# Patient Record
Sex: Female | Born: 1935 | Race: White | Hispanic: No | Smoking: Never smoker
Health system: Southern US, Community
[De-identification: ages and names within clinical notes are randomized; demographics above are authoritative.]

## PROBLEM LIST (undated history)

## (undated) DIAGNOSIS — E0590 Thyrotoxicosis, unspecified without thyrotoxic crisis or storm: Secondary | ICD-10-CM

## (undated) DIAGNOSIS — M899 Disorder of bone, unspecified: Secondary | ICD-10-CM

## (undated) DIAGNOSIS — M25569 Pain in unspecified knee: Secondary | ICD-10-CM

## (undated) DIAGNOSIS — M25559 Pain in unspecified hip: Secondary | ICD-10-CM

## (undated) DIAGNOSIS — T8859XA Other complications of anesthesia, initial encounter: Secondary | ICD-10-CM

## (undated) DIAGNOSIS — T4145XA Adverse effect of unspecified anesthetic, initial encounter: Secondary | ICD-10-CM

## (undated) DIAGNOSIS — M949 Disorder of cartilage, unspecified: Secondary | ICD-10-CM

## (undated) DIAGNOSIS — G2581 Restless legs syndrome: Secondary | ICD-10-CM

## (undated) DIAGNOSIS — R209 Unspecified disturbances of skin sensation: Secondary | ICD-10-CM

## (undated) DIAGNOSIS — E162 Hypoglycemia, unspecified: Secondary | ICD-10-CM

## (undated) DIAGNOSIS — R2681 Unsteadiness on feet: Secondary | ICD-10-CM

## (undated) DIAGNOSIS — H269 Unspecified cataract: Secondary | ICD-10-CM

## (undated) DIAGNOSIS — R112 Nausea with vomiting, unspecified: Secondary | ICD-10-CM

## (undated) DIAGNOSIS — M24569 Contracture, unspecified knee: Secondary | ICD-10-CM

## (undated) DIAGNOSIS — M1990 Unspecified osteoarthritis, unspecified site: Secondary | ICD-10-CM

## (undated) DIAGNOSIS — Z9889 Other specified postprocedural states: Secondary | ICD-10-CM

## (undated) HISTORY — DX: Unspecified cataract: H26.9

## (undated) HISTORY — DX: Contracture, unspecified knee: M24.569

## (undated) HISTORY — DX: Pain in unspecified knee: M25.569

## (undated) HISTORY — DX: Thyrotoxicosis, unspecified without thyrotoxic crisis or storm: E05.90

## (undated) HISTORY — DX: Restless legs syndrome: G25.81

## (undated) HISTORY — PX: CHOLECYSTECTOMY: SHX55

## (undated) HISTORY — DX: Pain in unspecified hip: M25.559

## (undated) HISTORY — DX: Disorder of cartilage, unspecified: M94.9

## (undated) HISTORY — DX: Disorder of bone, unspecified: M89.9

## (undated) HISTORY — DX: Unspecified disturbances of skin sensation: R20.9

---
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
## 1998-05-09 ENCOUNTER — Other Ambulatory Visit: Admission: RE | Admit: 1998-05-09 | Discharge: 1998-05-09 | Payer: Self-pay | Admitting: Obstetrics and Gynecology

## 1999-03-06 ENCOUNTER — Encounter: Admission: RE | Admit: 1999-03-06 | Discharge: 1999-04-03 | Payer: Self-pay | Admitting: Endocrinology

## 1999-08-04 ENCOUNTER — Other Ambulatory Visit: Admission: RE | Admit: 1999-08-04 | Discharge: 1999-08-04 | Payer: Self-pay | Admitting: Obstetrics and Gynecology

## 1999-11-16 ENCOUNTER — Other Ambulatory Visit: Admission: RE | Admit: 1999-11-16 | Discharge: 1999-11-16 | Payer: Self-pay | Admitting: Obstetrics and Gynecology

## 2000-09-09 ENCOUNTER — Other Ambulatory Visit: Admission: RE | Admit: 2000-09-09 | Discharge: 2000-09-09 | Payer: Self-pay | Admitting: Obstetrics and Gynecology

## 2001-09-18 ENCOUNTER — Other Ambulatory Visit: Admission: RE | Admit: 2001-09-18 | Discharge: 2001-09-18 | Payer: Self-pay | Admitting: Obstetrics and Gynecology

## 2003-03-03 ENCOUNTER — Inpatient Hospital Stay (HOSPITAL_COMMUNITY): Admission: AD | Admit: 2003-03-03 | Discharge: 2003-03-05 | Payer: Self-pay | Admitting: Endocrinology

## 2003-03-04 ENCOUNTER — Encounter: Payer: Self-pay | Admitting: *Deleted

## 2004-09-01 ENCOUNTER — Encounter: Admission: RE | Admit: 2004-09-01 | Discharge: 2004-09-01 | Payer: Self-pay | Admitting: Family Medicine

## 2005-03-10 ENCOUNTER — Emergency Department (HOSPITAL_COMMUNITY): Admission: EM | Admit: 2005-03-10 | Discharge: 2005-03-10 | Payer: Self-pay | Admitting: Emergency Medicine

## 2009-05-11 LAB — HM COLONOSCOPY

## 2011-08-13 DIAGNOSIS — H902 Conductive hearing loss, unspecified: Secondary | ICD-10-CM | POA: Diagnosis not present

## 2011-08-13 DIAGNOSIS — H6120 Impacted cerumen, unspecified ear: Secondary | ICD-10-CM | POA: Diagnosis not present

## 2011-12-12 DIAGNOSIS — Z1231 Encounter for screening mammogram for malignant neoplasm of breast: Secondary | ICD-10-CM | POA: Diagnosis not present

## 2011-12-27 DIAGNOSIS — Z13220 Encounter for screening for lipoid disorders: Secondary | ICD-10-CM | POA: Diagnosis not present

## 2011-12-27 DIAGNOSIS — E785 Hyperlipidemia, unspecified: Secondary | ICD-10-CM | POA: Diagnosis not present

## 2011-12-31 DIAGNOSIS — M1990 Unspecified osteoarthritis, unspecified site: Secondary | ICD-10-CM | POA: Diagnosis not present

## 2011-12-31 DIAGNOSIS — H6120 Impacted cerumen, unspecified ear: Secondary | ICD-10-CM | POA: Diagnosis not present

## 2011-12-31 DIAGNOSIS — M25569 Pain in unspecified knee: Secondary | ICD-10-CM | POA: Diagnosis not present

## 2011-12-31 DIAGNOSIS — M899 Disorder of bone, unspecified: Secondary | ICD-10-CM | POA: Diagnosis not present

## 2012-01-07 ENCOUNTER — Other Ambulatory Visit: Payer: Self-pay | Admitting: Internal Medicine

## 2012-01-07 ENCOUNTER — Ambulatory Visit
Admission: RE | Admit: 2012-01-07 | Discharge: 2012-01-07 | Disposition: A | Discharge status: Home / Self Care | Payer: Medicare Other | Source: Ambulatory Visit | Attending: Internal Medicine | Admitting: Internal Medicine

## 2012-01-07 DIAGNOSIS — R52 Pain, unspecified: Secondary | ICD-10-CM

## 2012-01-07 DIAGNOSIS — M25569 Pain in unspecified knee: Secondary | ICD-10-CM | POA: Diagnosis not present

## 2012-01-07 DIAGNOSIS — M1710 Unilateral primary osteoarthritis, unspecified knee: Secondary | ICD-10-CM | POA: Diagnosis not present

## 2012-04-10 DIAGNOSIS — H6120 Impacted cerumen, unspecified ear: Secondary | ICD-10-CM | POA: Diagnosis not present

## 2012-04-10 DIAGNOSIS — H902 Conductive hearing loss, unspecified: Secondary | ICD-10-CM | POA: Diagnosis not present

## 2012-05-30 DIAGNOSIS — Z23 Encounter for immunization: Secondary | ICD-10-CM | POA: Diagnosis not present

## 2012-08-18 DIAGNOSIS — E0590 Thyrotoxicosis, unspecified without thyrotoxic crisis or storm: Secondary | ICD-10-CM | POA: Diagnosis not present

## 2012-08-18 DIAGNOSIS — H269 Unspecified cataract: Secondary | ICD-10-CM | POA: Diagnosis not present

## 2012-08-18 DIAGNOSIS — M1990 Unspecified osteoarthritis, unspecified site: Secondary | ICD-10-CM | POA: Diagnosis not present

## 2012-09-16 ENCOUNTER — Ambulatory Visit
Admission: RE | Admit: 2012-09-16 | Discharge: 2012-09-16 | Disposition: A | Discharge status: Home / Self Care | Payer: Medicare Other | Source: Ambulatory Visit | Attending: Internal Medicine | Admitting: Internal Medicine

## 2012-09-16 ENCOUNTER — Other Ambulatory Visit: Payer: Self-pay | Admitting: Internal Medicine

## 2012-09-16 DIAGNOSIS — G8929 Other chronic pain: Secondary | ICD-10-CM

## 2012-09-16 DIAGNOSIS — M169 Osteoarthritis of hip, unspecified: Secondary | ICD-10-CM | POA: Diagnosis not present

## 2012-10-16 DIAGNOSIS — M169 Osteoarthritis of hip, unspecified: Secondary | ICD-10-CM | POA: Diagnosis not present

## 2012-10-16 DIAGNOSIS — M25559 Pain in unspecified hip: Secondary | ICD-10-CM | POA: Diagnosis not present

## 2012-10-23 ENCOUNTER — Encounter: Payer: Self-pay | Admitting: Internal Medicine

## 2012-10-23 ENCOUNTER — Ambulatory Visit (INDEPENDENT_AMBULATORY_CARE_PROVIDER_SITE_OTHER): Payer: Medicare Other | Admitting: Internal Medicine

## 2012-10-23 VITALS — BP 112/62 | HR 80 | Temp 97.9°F | Resp 15 | Wt 119.0 lb

## 2012-10-23 DIAGNOSIS — M169 Osteoarthritis of hip, unspecified: Secondary | ICD-10-CM | POA: Insufficient documentation

## 2012-10-23 DIAGNOSIS — I10 Essential (primary) hypertension: Secondary | ICD-10-CM | POA: Diagnosis not present

## 2012-10-23 DIAGNOSIS — Z01818 Encounter for other preprocedural examination: Secondary | ICD-10-CM

## 2012-10-23 DIAGNOSIS — M1610 Unilateral primary osteoarthritis, unspecified hip: Secondary | ICD-10-CM | POA: Diagnosis not present

## 2012-10-23 NOTE — Assessment & Plan Note (Addendum)
Left greater than right.  Has plans for replacement via anterior approach with Dr. Magnus Ivan.  He has asked me to medically clear her.  She had a normal EKG today and all of her labwork has been unremarkable.  We discussed the risks of surgery due to her age, but she simply feels she does not have good quality of life moving as slowly as she does at this time.  She has no other active medical conditions besides her chronic, physical disability and discomfort.  Her son was also present for the visit.  They also understand that she will require significant rehab in a SNF after the surgery.  We can provide her a list of the [facility] facilities which does include [facility] and [facility].

## 2012-10-23 NOTE — Progress Notes (Signed)
Patient ID: Stacey Clay, female   DOB: 14-Jan-1923, 79 y.o.   MRN: [id number]  Allergies  Allergen Reactions  . Aleve (Naproxen Sodium)   . Aspirin   . Darvocet (Propoxyphene-Acetaminophen)   . Darvon (Propoxyphene Hcl)   . Lomotil (Diphenoxylate)   . Ultram (Tramadol)     Chief Complaint  Patient presents with  . Medical Managment of Chronic Issues  . Hip Surgery to be scheduled by Dr.Blackman. Need clearance f    HPI: Patient is a 79 y.o. Caucasian female seen in the office today for medical clearance for hip surgery.Has decided she wants hip replacement.  Dr. Magnus Ivan and a friend of hers spoke about [facility].  See more details below.  Review of Systems:  Review of Systems  Constitutional: Negative for fever, chills and weight loss.  HENT: Positive for hearing loss.        Flushing and debrox did not help.  Did go to ENT Dr. Ezzard Standing identified hearing loss.  Didn't want to get into hearing aides at that time.    Eyes: Negative for blurred vision.  Respiratory: Negative for shortness of breath.   Cardiovascular: Negative for chest pain, palpitations and leg swelling.  Gastrointestinal: Negative for heartburn and constipation.  Genitourinary: Negative for urgency.       Does have to hurry a little at night, but makes it to bathroom  Musculoskeletal: Positive for joint pain. Negative for falls.  Skin: Negative for rash.  Neurological: Negative for dizziness and weakness.       Has right knee contracture  Endo/Heme/Allergies: Does not bruise/bleed easily.  Psychiatric/Behavioral: Negative for depression and memory loss.     Past Medical History  Diagnosis Date  . Contracture of lower leg joint   . Pain in joint, pelvic region and thigh   . Disturbance of skin sensation   . Pain in joint, lower leg   . Restless legs syndrome (RLS)   . Disorder of bone and cartilage, unspecified   . 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 Unspecified cataract    Past Surgical History  Procedure Laterality Date  . Tonsillectomy  1932  . Goiter resection  1958  . Cholecystectomy     Social History:   reports that she has never smoked. She does not have any smokeless tobacco history on file. She reports that she does not drink alcohol or use illicit drugs.  No family history on file.  Medications: Patient's Medications  New Prescriptions   No medications on file  Previous Medications   ACETAMINOPHEN (TYLENOL) 500 MG TABLET    Take 500 mg by mouth every 6 (six) hours as needed for pain.   MULTIPLE VITAMIN (MULTIVITAMIN) TABLET    Take 1 tablet by mouth daily.  Modified Medications   No medications on file  Discontinued Medications   No medications on file   Physical Exam: Filed Vitals:   10/23/12 1553  BP: 112/62  Pulse: 80  Temp: 97.9 F (36.6 C)  TempSrc: Oral  Resp: 15  Weight: 119 lb (53.978 kg)  SpO2: 99%  Physical Exam  Constitutional: She is oriented to person, place, and time.  Thin Caucasian female, NAD  HENT:  Head: Normocephalic and atraumatic.  Right Ear: External ear normal.  Left Ear: External ear normal.  Nose: Nose normal.  Mouth/Throat: Oropharynx is clear and moist.  Eyes: Conjunctivae and EOM are normal. Pupils are equal, round, and  reactive to light.  Neck: Normal range of motion. Neck supple. No JVD present.  Cardiovascular: Normal rate, regular rhythm, normal heart sounds and intact distal pulses.   Pulmonary/Chest: Effort normal and breath sounds normal.  Abdominal: Soft. Bowel sounds are normal. She exhibits no distension and no mass. There is no tenderness.  Musculoskeletal: She exhibits no edema and no tenderness.  Contracture present of right knee, discomfort in left hip and very slow, asymmetric gait using rollator walker  Neurological: She is alert and oriented to person, place, and time. No cranial nerve deficit.  Skin: Skin is warm and dry.   Psychiatric: She has a normal mood and affect. Her behavior is normal. Judgment and thought content normal.    Labs reviewed: 07/12/2010  (Dr. Dagoberto Ligas) CBC: rbc 4.14, Hemoglobin 12.8, wbc 4.3 CMP: glucose 88, BUN 21, Creatinine 0.6 T4: 0.83, TSH: 2.48 12/27/2011 Lipid Panel Cholesterol 162 Triglycerides 88 Hdl 64 Ldl 80  08/18/12 CBC; WBC 4.7, RBC 3.90, HGB 12.8 Glucose; 96, BUN 19, Creatinine 0.53 TSH 2.510  Past Procedures: Yearly Pap Smears--not gone in a couple of years Yearly Mammograms--not gone in a couple of years Colonoscopy--done when had stomach bleeding, also had EGD, no polyps 12/16/2008-Bone Density: Low Bone Mass 1984-Swallowed radioactive Iodine 09/16/12 DG Hip Complete Left - Significant worsening of severe degenerative joint disease involving the left hip. 09/16/12 DG Pelvis 1-2 Views - Significant progression of degenerative joint disease involving both hips. 10/23/12:  EKG:  Normal sinus rhythm, no ischemia or infarct  Assessment/Plan DJD (degenerative joint disease) of hip Left greater than right.  Has plans for replacement via anterior approach with Dr. Magnus Ivan.  He has asked me to medically clear her.  She had a normal EKG today and all of her labwork has been unremarkable.  We discussed the risks of surgery due to her age, but she simply feels she does not have good quality of life moving as slowly as she does at this time.  She has no other active medical conditions besides her chronic, physical disability and discomfort.  Her son was also present for the visit.  They also understand that she will require significant rehab in a SNF after the surgery.  We can provide her a list of the [facility] facilities which does include [facility] and [facility].   Labs/tests ordered: EKG, CBC, BMP today

## 2012-10-23 NOTE — Patient Instructions (Signed)
Follow up with me after your surgery.

## 2012-10-24 LAB — BASIC METABOLIC PANEL
BUN/Creatinine Ratio: 49 — ABNORMAL HIGH (ref 11–26)
BUN: 24 mg/dL (ref 8–27)
CO2: 23 mmol/L (ref 19–28)
Calcium: 9.7 mg/dL (ref 8.6–10.2)
Chloride: 105 mmol/L (ref 97–108)
Creatinine, Ser: 0.49 mg/dL — ABNORMAL LOW (ref 0.57–1.00)
GFR calc Af Amer: 101 mL/min/{1.73_m2} (ref >59)
GFR calc non Af Amer: 88 mL/min/{1.73_m2} (ref >59)
Glucose: 80 mg/dL (ref 65–99)
Potassium: 4.6 mmol/L (ref 3.5–5.2)
Sodium: 140 mmol/L (ref 134–144)

## 2012-10-24 LAB — CBC WITH DIFFERENTIAL/PLATELET
Basophils Absolute: 0 10*3/uL (ref 0.0–0.2)
Basos: 0 % (ref 0–3)
Eos: 3 % (ref 0–5)
Eosinophils Absolute: 0.1 10*3/uL (ref 0.0–0.4)
HCT: 38 % (ref 34.0–46.6)
Hemoglobin: 12.9 g/dL (ref 11.1–15.9)
Immature Grans (Abs): 0 10*3/uL (ref 0.0–0.1)
Immature Granulocytes: 0 % (ref 0–2)
Lymphocytes Absolute: 1.3 10*3/uL (ref 0.7–3.1)
Lymphs: 31 % (ref 14–46)
MCH: 32.5 pg (ref 26.6–33.0)
MCHC: 33.9 g/dL (ref 31.5–35.7)
MCV: 96 fL (ref 79–97)
Monocytes Absolute: 0.6 10*3/uL (ref 0.1–0.9)
Monocytes: 14 % — ABNORMAL HIGH (ref 4–12)
Neutrophils Absolute: 2.1 10*3/uL (ref 1.4–7.0)
Neutrophils Relative %: 52 % (ref 40–74)
RBC: 3.97 x10E6/uL (ref 3.77–5.28)
RDW: 12.9 % (ref 12.3–15.4)
WBC: 4.1 10*3/uL (ref 3.4–10.8)

## 2012-11-04 DIAGNOSIS — H902 Conductive hearing loss, unspecified: Secondary | ICD-10-CM | POA: Diagnosis not present

## 2012-11-04 DIAGNOSIS — H6120 Impacted cerumen, unspecified ear: Secondary | ICD-10-CM | POA: Diagnosis not present

## 2012-11-09 ENCOUNTER — Encounter: Payer: Self-pay | Admitting: Internal Medicine

## 2012-11-10 NOTE — Addendum Note (Signed)
Addended by: Avis Epley F on: 11/10/2012 08:38 AM   Modules accepted: Orders

## 2012-11-11 ENCOUNTER — Other Ambulatory Visit (HOSPITAL_COMMUNITY): Payer: Self-pay | Admitting: Orthopaedic Surgery

## 2012-11-13 ENCOUNTER — Encounter (HOSPITAL_COMMUNITY): Payer: Self-pay | Admitting: Pharmacy Technician

## 2012-11-19 ENCOUNTER — Other Ambulatory Visit (HOSPITAL_COMMUNITY): Payer: Self-pay | Admitting: Orthopaedic Surgery

## 2012-11-20 ENCOUNTER — Encounter (HOSPITAL_COMMUNITY)
Admission: RE | Admit: 2012-11-20 | Discharge: 2012-11-20 | Disposition: A | Discharge status: Home / Self Care | Payer: Medicare Other | Source: Ambulatory Visit | Attending: Orthopaedic Surgery | Admitting: Orthopaedic Surgery

## 2012-11-20 ENCOUNTER — Encounter (HOSPITAL_COMMUNITY): Payer: Self-pay

## 2012-11-20 DIAGNOSIS — M899 Disorder of bone, unspecified: Secondary | ICD-10-CM | POA: Diagnosis not present

## 2012-11-20 DIAGNOSIS — S72009A Fracture of unspecified part of neck of unspecified femur, initial encounter for closed fracture: Secondary | ICD-10-CM | POA: Diagnosis not present

## 2012-11-20 DIAGNOSIS — R269 Unspecified abnormalities of gait and mobility: Secondary | ICD-10-CM | POA: Diagnosis not present

## 2012-11-20 DIAGNOSIS — E162 Hypoglycemia, unspecified: Secondary | ICD-10-CM | POA: Diagnosis not present

## 2012-11-20 DIAGNOSIS — M24569 Contracture, unspecified knee: Secondary | ICD-10-CM | POA: Diagnosis not present

## 2012-11-20 DIAGNOSIS — Z471 Aftercare following joint replacement surgery: Secondary | ICD-10-CM | POA: Diagnosis not present

## 2012-11-20 DIAGNOSIS — G2581 Restless legs syndrome: Secondary | ICD-10-CM | POA: Diagnosis not present

## 2012-11-20 DIAGNOSIS — M1610 Unilateral primary osteoarthritis, unspecified hip: Secondary | ICD-10-CM | POA: Diagnosis not present

## 2012-11-20 DIAGNOSIS — M169 Osteoarthritis of hip, unspecified: Secondary | ICD-10-CM | POA: Diagnosis present

## 2012-11-20 DIAGNOSIS — M1990 Unspecified osteoarthritis, unspecified site: Secondary | ICD-10-CM | POA: Diagnosis not present

## 2012-11-20 DIAGNOSIS — M6281 Muscle weakness (generalized): Secondary | ICD-10-CM | POA: Diagnosis not present

## 2012-11-20 DIAGNOSIS — Z01812 Encounter for preprocedural laboratory examination: Secondary | ICD-10-CM | POA: Diagnosis not present

## 2012-11-20 DIAGNOSIS — M25559 Pain in unspecified hip: Secondary | ICD-10-CM | POA: Diagnosis not present

## 2012-11-20 DIAGNOSIS — R293 Abnormal posture: Secondary | ICD-10-CM | POA: Diagnosis not present

## 2012-11-20 DIAGNOSIS — Z96649 Presence of unspecified artificial hip joint: Secondary | ICD-10-CM | POA: Diagnosis not present

## 2012-11-20 HISTORY — DX: Other specified postprocedural states: Z98.890

## 2012-11-20 HISTORY — DX: Unsteadiness on feet: R26.81

## 2012-11-20 HISTORY — DX: Nausea with vomiting, unspecified: R11.2

## 2012-11-20 HISTORY — DX: Adverse effect of unspecified anesthetic, initial encounter: T41.45XA

## 2012-11-20 HISTORY — DX: Other complications of anesthesia, initial encounter: T88.59XA

## 2012-11-20 HISTORY — DX: Nausea with vomiting, unspecified: Z98.890

## 2012-11-20 HISTORY — DX: Unspecified osteoarthritis, unspecified site: M19.90

## 2012-11-20 LAB — CBC
HCT: 40.7 % (ref 36.0–46.0)
Hemoglobin: 13.1 g/dL (ref 12.0–15.0)
MCH: 32 pg (ref 26.0–34.0)
MCHC: 32.2 g/dL (ref 30.0–36.0)
RDW: 13.2 % (ref 11.5–15.5)

## 2012-11-20 LAB — ABO/RH: ABO/RH(D): A POS

## 2012-11-20 LAB — SURGICAL PCR SCREEN: Staphylococcus aureus: POSITIVE — AB

## 2012-11-20 LAB — URINALYSIS, ROUTINE W REFLEX MICROSCOPIC
Glucose, UA: NEGATIVE mg/dL
Ketones, ur: NEGATIVE mg/dL
Protein, ur: NEGATIVE mg/dL
Urobilinogen, UA: 0.2 mg/dL (ref 0.0–1.0)

## 2012-11-20 LAB — BASIC METABOLIC PANEL
BUN: 22 mg/dL (ref 6–23)
Creatinine, Ser: 0.45 mg/dL — ABNORMAL LOW (ref 0.50–1.10)
GFR calc Af Amer: >90 mL/min (ref >90)
GFR calc non Af Amer: 88 mL/min — ABNORMAL LOW (ref >90)
Glucose, Bld: 76 mg/dL (ref 70–99)

## 2012-11-20 LAB — PROTIME-INR
INR: 0.95 (ref 0.00–1.49)
Prothrombin Time: 12.6 seconds (ref 11.6–15.2)

## 2012-11-20 LAB — URINE MICROSCOPIC-ADD ON

## 2012-11-20 NOTE — Pre-Procedure Instructions (Addendum)
11-20-12 EKG 4'14-Epic. No CXR today. Pt. Aware will see anesthesia AM of surgery preop.

## 2012-11-20 NOTE — Patient Instructions (Addendum)
20 MAHNOOR MATHISEN  11/20/2012   Your procedure is scheduled on:   11-28-2012  Report to [facility] at     0645   AM   Call this number if you have problems the morning of surgery: [phone]  Or Presurgical Testing [phone](Arshi Duarte)     Do not eat food:After Midnight.    Take these medicines the morning of surgery with A SIP OF WATER: Tylenol.   Do not wear jewelry, make-up or nail polish.  Do not wear lotions, powders, or perfumes. You may wear deodorant.  Do not shave 12 hours prior to first CHG shower(legs and under arms).(face and neck okay.)  Do not bring valuables to the hospital.  Contacts, dentures or bridgework,body piercing,  may not be worn into surgery.  Leave suitcase in the car. After surgery it may be brought to your room.  For patients admitted to the hospital, checkout time is 11:00 AM the day of discharge.   Patients discharged the day of surgery will not be allowed to drive home. Must have responsible person with you x 24 hours once discharged.  Name and phone number of your driver: Fayrene Fearing [phone] cell  Special Instructions: CHG(Chlorhedine 4%-"Hibiclens","Betasept","Aplicare") Shower Use Special Wash: see special instructions.(avoid face and genitals)   Please read over the following fact sheets that you were given: MRSA Information, Blood Transfusion fact sheet, Incentive Spirometry Instruction.    Failure to follow these instructions may result in Cancellation of your surgery.   Patient signature_______________________________________________________

## 2012-11-21 NOTE — Progress Notes (Signed)
+   mrsa and staph results faxed to be c blackman by fax in epic

## 2012-11-28 ENCOUNTER — Inpatient Hospital Stay (HOSPITAL_COMMUNITY): Payer: Medicare Other

## 2012-11-28 ENCOUNTER — Inpatient Hospital Stay (HOSPITAL_COMMUNITY): Payer: Medicare Other | Admitting: Anesthesiology

## 2012-11-28 ENCOUNTER — Encounter (HOSPITAL_COMMUNITY): Payer: Self-pay | Admitting: Anesthesiology

## 2012-11-28 ENCOUNTER — Inpatient Hospital Stay (HOSPITAL_COMMUNITY)
Admission: RE | Admit: 2012-11-28 | Discharge: 2012-12-02 | DRG: 470 | Disposition: A | Discharge status: Skilled Nursing Facility | Payer: Medicare Other | Source: Ambulatory Visit | Attending: Orthopaedic Surgery | Admitting: Orthopaedic Surgery

## 2012-11-28 ENCOUNTER — Encounter (HOSPITAL_COMMUNITY): Payer: Self-pay | Admitting: *Deleted

## 2012-11-28 ENCOUNTER — Encounter (HOSPITAL_COMMUNITY)
Admission: RE | Disposition: A | Discharge status: Skilled Nursing Facility | Payer: Self-pay | Source: Ambulatory Visit | Attending: Orthopaedic Surgery

## 2012-11-28 DIAGNOSIS — M1610 Unilateral primary osteoarthritis, unspecified hip: Principal | ICD-10-CM | POA: Diagnosis present

## 2012-11-28 DIAGNOSIS — R269 Unspecified abnormalities of gait and mobility: Secondary | ICD-10-CM | POA: Diagnosis present

## 2012-11-28 DIAGNOSIS — M169 Osteoarthritis of hip, unspecified: Secondary | ICD-10-CM

## 2012-11-28 DIAGNOSIS — E162 Hypoglycemia, unspecified: Secondary | ICD-10-CM | POA: Diagnosis present

## 2012-11-28 DIAGNOSIS — Z01812 Encounter for preprocedural laboratory examination: Secondary | ICD-10-CM

## 2012-11-28 HISTORY — PX: TOTAL HIP ARTHROPLASTY: SHX124

## 2012-11-28 HISTORY — DX: Hypoglycemia, unspecified: E16.2

## 2012-11-28 LAB — TYPE AND SCREEN: ABO/RH(D): A POS

## 2012-11-28 SURGERY — ARTHROPLASTY, HIP, TOTAL, ANTERIOR APPROACH
Anesthesia: Spinal | Site: Hip | Laterality: Left | Wound class: Clean

## 2012-11-28 MED ORDER — METHOCARBAMOL 500 MG PO TABS
500.0000 mg | ORAL_TABLET | Freq: Four times a day (QID) | ORAL | Status: DC | PRN
  Administered 2012-11-29 – 2012-12-01 (×3): 500 mg via ORAL
  Filled 2012-11-28 (×3): qty 1

## 2012-11-28 MED ORDER — MIDAZOLAM HCL 5 MG/5ML IJ SOLN
INTRAMUSCULAR | Status: DC | PRN
  Administered 2012-11-28: 2 mg via INTRAVENOUS

## 2012-11-28 MED ORDER — LACTATED RINGERS IV SOLN
INTRAVENOUS | Status: DC
  Administered 2012-11-28: 1000 mL via INTRAVENOUS
  Administered 2012-11-28 (×2): via INTRAVENOUS

## 2012-11-28 MED ORDER — ADULT MULTIVITAMIN W/MINERALS CH
1.0000 | ORAL_TABLET | Freq: Every day | ORAL | Status: DC
  Administered 2012-11-29 – 2012-12-01 (×3): 1 via ORAL
  Filled 2012-11-28 (×5): qty 1

## 2012-11-28 MED ORDER — FENTANYL CITRATE 0.05 MG/ML IJ SOLN
INTRAMUSCULAR | Status: DC | PRN
  Administered 2012-11-28: 50 ug via INTRAVENOUS

## 2012-11-28 MED ORDER — POLYETHYLENE GLYCOL 3350 17 G PO PACK
17.0000 g | PACK | Freq: Every day | ORAL | Status: DC | PRN
  Administered 2012-11-30 – 2012-12-01 (×3): 17 g via ORAL

## 2012-11-28 MED ORDER — SODIUM CHLORIDE 0.9 % IR SOLN
Status: DC | PRN
  Administered 2012-11-28: 1000 mL

## 2012-11-28 MED ORDER — FENTANYL CITRATE 0.05 MG/ML IJ SOLN
25.0000 ug | INTRAMUSCULAR | Status: DC | PRN
  Administered 2012-11-28: 25 ug via INTRAVENOUS
  Administered 2012-11-28: 50 ug via INTRAVENOUS
  Administered 2012-11-28: 25 ug via INTRAVENOUS

## 2012-11-28 MED ORDER — SODIUM CHLORIDE 0.9 % IV SOLN
INTRAVENOUS | Status: DC
  Administered 2012-11-28 – 2012-11-29 (×2): via INTRAVENOUS

## 2012-11-28 MED ORDER — MENTHOL 3 MG MT LOZG: 1.0000 | LOZENGE | OROMUCOSAL | Status: DC | PRN

## 2012-11-28 MED ORDER — ONDANSETRON HCL 4 MG/2ML IJ SOLN
INTRAMUSCULAR | Status: DC | PRN
  Administered 2012-11-28: 4 mg via INTRAVENOUS

## 2012-11-28 MED ORDER — METOCLOPRAMIDE HCL 5 MG/ML IJ SOLN: 5.0000 mg | Freq: Three times a day (TID) | INTRAMUSCULAR | Status: DC | PRN

## 2012-11-28 MED ORDER — PROPOFOL 10 MG/ML IV BOLUS
INTRAVENOUS | Status: DC | PRN
  Administered 2012-11-28: 10 mg via INTRAVENOUS

## 2012-11-28 MED ORDER — ONDANSETRON HCL 4 MG/2ML IJ SOLN
4.0000 mg | Freq: Four times a day (QID) | INTRAMUSCULAR | Status: DC | PRN
  Administered 2012-11-28 – 2012-11-29 (×3): 4 mg via INTRAVENOUS
  Filled 2012-11-28 (×3): qty 2

## 2012-11-28 MED ORDER — PROMETHAZINE HCL 25 MG/ML IJ SOLN
6.2500 mg | INTRAMUSCULAR | Status: AC | PRN
  Administered 2012-11-28 (×2): 6.25 mg via INTRAVENOUS

## 2012-11-28 MED ORDER — ONDANSETRON HCL 4 MG PO TABS
4.0000 mg | ORAL_TABLET | Freq: Four times a day (QID) | ORAL | Status: DC | PRN
  Administered 2012-12-02: 4 mg via ORAL
  Filled 2012-11-28: qty 1

## 2012-11-28 MED ORDER — CEFAZOLIN SODIUM 1-5 GM-% IV SOLN
1.0000 g | Freq: Four times a day (QID) | INTRAVENOUS | Status: AC
  Administered 2012-11-28 (×2): 1 g via INTRAVENOUS
  Filled 2012-11-28 (×2): qty 50

## 2012-11-28 MED ORDER — STERILE WATER FOR IRRIGATION IR SOLN
Status: DC | PRN
  Administered 2012-11-28: 3000 mL

## 2012-11-28 MED ORDER — METHOCARBAMOL 100 MG/ML IJ SOLN
500.0000 mg | Freq: Four times a day (QID) | INTRAVENOUS | Status: DC | PRN
  Administered 2012-11-28: 500 mg via INTRAVENOUS
  Filled 2012-11-28: qty 5

## 2012-11-28 MED ORDER — PATIENT'S GUIDE TO USING COUMADIN BOOK
Freq: Once | Status: AC
  Administered 2012-11-28: 1
  Filled 2012-11-28: qty 1

## 2012-11-28 MED ORDER — PROPOFOL INFUSION 10 MG/ML OPTIME
INTRAVENOUS | Status: DC | PRN
  Administered 2012-11-28: 75 ug/kg/min via INTRAVENOUS

## 2012-11-28 MED ORDER — ALUM & MAG HYDROXIDE-SIMETH 200-200-20 MG/5ML PO SUSP: 30.0000 mL | ORAL | Status: DC | PRN

## 2012-11-28 MED ORDER — HYDROCODONE-ACETAMINOPHEN 5-325 MG PO TABS
1.0000 | ORAL_TABLET | ORAL | Status: DC | PRN
  Administered 2012-12-01 (×2): 1 via ORAL
  Filled 2012-11-28 (×2): qty 1

## 2012-11-28 MED ORDER — ACETAMINOPHEN 10 MG/ML IV SOLN: 1000.0000 mg | Freq: Once | INTRAVENOUS | Status: DC | PRN

## 2012-11-28 MED ORDER — ACETAMINOPHEN 650 MG RE SUPP: 650.0000 mg | Freq: Four times a day (QID) | RECTAL | Status: DC | PRN

## 2012-11-28 MED ORDER — MEPERIDINE HCL 50 MG/ML IJ SOLN: 6.2500 mg | INTRAMUSCULAR | Status: DC | PRN

## 2012-11-28 MED ORDER — 0.9 % SODIUM CHLORIDE (POUR BTL) OPTIME
TOPICAL | Status: DC | PRN
  Administered 2012-11-28: 1000 mL

## 2012-11-28 MED ORDER — WARFARIN - PHARMACIST DOSING INPATIENT: Freq: Every day | Status: DC

## 2012-11-28 MED ORDER — ZOLPIDEM TARTRATE 5 MG PO TABS: 5.0000 mg | ORAL_TABLET | Freq: Every evening | ORAL | Status: DC | PRN

## 2012-11-28 MED ORDER — OXYCODONE HCL 5 MG PO TABS
5.0000 mg | ORAL_TABLET | ORAL | Status: DC | PRN
  Administered 2012-11-28 – 2012-11-29 (×4): 10 mg via ORAL
  Administered 2012-11-30: 5 mg via ORAL
  Filled 2012-11-28 (×2): qty 2
  Filled 2012-11-28 (×2): qty 1
  Filled 2012-11-28 (×2): qty 2

## 2012-11-28 MED ORDER — METOCLOPRAMIDE HCL 10 MG PO TABS
5.0000 mg | ORAL_TABLET | Freq: Three times a day (TID) | ORAL | Status: DC | PRN
  Administered 2012-12-02: 10 mg via ORAL
  Filled 2012-11-28: qty 1

## 2012-11-28 MED ORDER — CEFAZOLIN SODIUM-DEXTROSE 2-3 GM-% IV SOLR
2.0000 g | INTRAVENOUS | Status: AC
  Administered 2012-11-28: 2 g via INTRAVENOUS

## 2012-11-28 MED ORDER — WARFARIN SODIUM 4 MG PO TABS
4.0000 mg | ORAL_TABLET | Freq: Once | ORAL | Status: AC
  Administered 2012-11-28: 4 mg via ORAL
  Filled 2012-11-28: qty 1

## 2012-11-28 MED ORDER — DOCUSATE SODIUM 100 MG PO CAPS
100.0000 mg | ORAL_CAPSULE | Freq: Two times a day (BID) | ORAL | Status: DC
  Administered 2012-11-29 – 2012-12-02 (×6): 100 mg via ORAL

## 2012-11-28 MED ORDER — BUPIVACAINE IN DEXTROSE 0.75-8.25 % IT SOLN
INTRATHECAL | Status: DC | PRN
  Administered 2012-11-28: 2 mL via INTRATHECAL

## 2012-11-28 MED ORDER — KETAMINE HCL 10 MG/ML IJ SOLN
INTRAMUSCULAR | Status: DC | PRN
  Administered 2012-11-28: 10 mg via INTRAVENOUS

## 2012-11-28 MED ORDER — BISACODYL 5 MG PO TBEC
5.0000 mg | DELAYED_RELEASE_TABLET | Freq: Every day | ORAL | Status: DC | PRN
  Administered 2012-12-02: 5 mg via ORAL
  Filled 2012-11-28: qty 1

## 2012-11-28 MED ORDER — WARFARIN VIDEO: Freq: Once | Status: DC

## 2012-11-28 MED ORDER — ACETAMINOPHEN 325 MG PO TABS: 650.0000 mg | ORAL_TABLET | Freq: Four times a day (QID) | ORAL | Status: DC | PRN

## 2012-11-28 MED ORDER — DIPHENHYDRAMINE HCL 12.5 MG/5ML PO ELIX: 12.5000 mg | ORAL_SOLUTION | ORAL | Status: DC | PRN

## 2012-11-28 MED ORDER — PHENOL 1.4 % MT LIQD: 1.0000 | OROMUCOSAL | Status: DC | PRN

## 2012-11-28 MED ORDER — ACETAMINOPHEN 10 MG/ML IV SOLN
INTRAVENOUS | Status: DC | PRN
  Administered 2012-11-28: 1000 mg via INTRAVENOUS

## 2012-11-28 MED ORDER — HYDROMORPHONE HCL PF 1 MG/ML IJ SOLN: 0.5000 mg | INTRAMUSCULAR | Status: DC | PRN

## 2012-11-28 SURGICAL SUPPLY — 39 items
ADH SKN CLS APL DERMABOND .7 (GAUZE/BANDAGES/DRESSINGS) ×1
BAG SPEC THK2 15X12 ZIP CLS (MISCELLANEOUS) ×2
BAG ZIPLOCK 12X15 (MISCELLANEOUS) ×4 IMPLANT
BLADE SAW SGTL 18X1.27X75 (BLADE) ×2 IMPLANT
CELLS DAT CNTRL 66122 CELL SVR (MISCELLANEOUS) ×1 IMPLANT
CLOTH BEACON ORANGE TIMEOUT ST (SAFETY) ×2 IMPLANT
DERMABOND ADVANCED (GAUZE/BANDAGES/DRESSINGS) ×1
DERMABOND ADVANCED .7 DNX12 (GAUZE/BANDAGES/DRESSINGS) ×1 IMPLANT
DRAPE C-ARM 42X72 X-RAY (DRAPES) ×2 IMPLANT
DRAPE STERI IOBAN 125X83 (DRAPES) ×2 IMPLANT
DRAPE U-SHAPE 47X51 STRL (DRAPES) ×6 IMPLANT
DRSG AQUACEL AG ADV 3.5X10 (GAUZE/BANDAGES/DRESSINGS) ×2 IMPLANT
DURAPREP 26ML APPLICATOR (WOUND CARE) ×2 IMPLANT
ELECT BLADE TIP CTD 4 INCH (ELECTRODE) ×2 IMPLANT
ELECT REM PT RETURN 9FT ADLT (ELECTROSURGICAL) ×2
ELECTRODE REM PT RTRN 9FT ADLT (ELECTROSURGICAL) ×1 IMPLANT
FACESHIELD LNG OPTICON STERILE (SAFETY) ×8 IMPLANT
GLOVE BIO SURGEON STRL SZ7.5 (GLOVE) ×2 IMPLANT
GLOVE BIOGEL PI IND STRL 8 (GLOVE) ×2 IMPLANT
GLOVE BIOGEL PI INDICATOR 8 (GLOVE) ×2
GLOVE ECLIPSE 8.0 STRL XLNG CF (GLOVE) ×2 IMPLANT
GOWN STRL REIN XL XLG (GOWN DISPOSABLE) ×4 IMPLANT
HANDPIECE INTERPULSE COAX TIP (DISPOSABLE) ×2
KIT BASIN OR (CUSTOM PROCEDURE TRAY) ×2 IMPLANT
PACK TOTAL JOINT (CUSTOM PROCEDURE TRAY) ×2 IMPLANT
PADDING CAST COTTON 6X4 STRL (CAST SUPPLIES) ×2 IMPLANT
RETRACTOR WND ALEXIS 18 MED (MISCELLANEOUS) ×1 IMPLANT
RTRCTR WOUND ALEXIS 18CM MED (MISCELLANEOUS) ×2
SET HNDPC FAN SPRY TIP SCT (DISPOSABLE) ×1 IMPLANT
SUT ETHIBOND NAB CT1 #1 30IN (SUTURE) ×4 IMPLANT
SUT ETHILON 3 0 PS 1 (SUTURE) ×2 IMPLANT
SUT MNCRL AB 4-0 PS2 18 (SUTURE) ×2 IMPLANT
SUT VIC AB 0 CT1 36 (SUTURE) ×2 IMPLANT
SUT VIC AB 1 CT1 36 (SUTURE) ×4 IMPLANT
SUT VIC AB 2-0 CT1 27 (SUTURE) ×4
SUT VIC AB 2-0 CT1 TAPERPNT 27 (SUTURE) ×2 IMPLANT
TOWEL OR 17X26 10 PK STRL BLUE (TOWEL DISPOSABLE) ×2 IMPLANT
TOWEL OR NON WOVEN STRL DISP B (DISPOSABLE) ×2 IMPLANT
TRAY FOLEY CATH 14FRSI W/METER (CATHETERS) ×2 IMPLANT

## 2012-11-28 NOTE — Progress Notes (Signed)
Portable AP Pelvis and Lateral Left Hip X-rays done. 

## 2012-11-28 NOTE — Progress Notes (Signed)
X-RAY RESULTS NOTED. 

## 2012-11-28 NOTE — Progress Notes (Signed)
ANTICOAGULATION CONSULT NOTE - Initial Consult  Pharmacy Consult for Warfarin Indication: VTE prophylaxis  Allergies  Allergen Reactions  . Aleve (Naproxen Sodium)     Stomach bleeding  . Aspirin     Stomach bleeding  . Darvocet (Propoxyphene-Acetaminophen)     Stomach bleeding  . Darvon (Propoxyphene Hcl)     Stomach bleeding  . Lomotil (Diphenoxylate)     Stomach bleeding  . Ultram (Tramadol)     Stomach bleeding. Patient does not recall taking this medication.    Patient Measurements: Height: 5\' 5"  (165.1 cm) Weight: 117 lb 4 oz (53.184 kg) IBW/kg (Calculated) : 57 Heparin Dosing Weight:   Vital Signs: Temp: 97.8 F (36.6 C) (05/23 1448) Temp src: Oral (05/23 1448) BP: 166/88 mmHg (05/23 1459) Pulse Rate: 91 (05/23 1448)  Labs: No results found for this basename: HGB, HCT, PLT, APTT, LABPROT, INR, HEPARINUNFRC, CREATININE, CKTOTAL, CKMB, TROPONINI,  in the last 72 hours  Estimated Creatinine Clearance: 41.6 ml/min (by C-G formula based on Cr of 0.45).   Medical History: Past Medical History  Diagnosis Date  . Contracture of lower leg joint   . Pain in joint, pelvic region and thigh   . Disturbance of skin sensation   . Pain in joint, lower leg   . Restless legs syndrome (RLS)   . Disorder of bone and cartilage, unspecified   . Unspecified cataract     not surgically ready  . Complication of anesthesia   . PONV (postoperative nausea and vomiting)   . Thyrotoxicosis without mention of goiter or other cause, without mention of thyrotoxic crisis or storm     tx. radioactive Iodine s/p yrs after having thyroid goiter surgery  . Arthritis     Osteoarthritis -knees, hips-"pseudo gout right knee" "curvature of spine"  . Gait instability     due to contractures and curvature to spine uses-walker to ambulate  . Hypoglycemia, unspecified   . Hypoglycemia     Medications:  Scheduled:  .  ceFAZolin (ANCEF) IV  1 g Intravenous Q6H  . docusate sodium  100 mg Oral  BID  . multivitamin with minerals  1 tablet Oral Daily  . warfarin  4 mg Oral ONCE-1800  . Warfarin - Pharmacist Dosing Inpatient   Does not apply q1800   Infusions:  . sodium chloride 75 mL/hr at 11/28/12 1540   PRN: acetaminophen, acetaminophen, alum & mag hydroxide-simeth, bisacodyl, diphenhydrAMINE, HYDROcodone-acetaminophen, HYDROmorphone (DILAUDID) injection, menthol-cetylpyridinium, methocarbamol (ROBAXIN) IV, methocarbamol, metoCLOPramide (REGLAN) injection, metoCLOPramide, ondansetron (ZOFRAN) IV, ondansetron, oxyCODONE, phenol, polyethylene glycol, zolpidem  Assessment: 79 yo F  Now S/P Left THA Goal of Therapy:  INR 2-3 Monitor platelets by anticoagulation protocol: Yes   Plan:  Start Coumadin 4mg  po at 1800 Daily INR Coumadin teaching book/video  Stacey Clay, Stacey Clay 11/28/2012,4:14 PM

## 2012-11-28 NOTE — Anesthesia Procedure Notes (Signed)
Spinal  Patient location during procedure: OR Start time: 11/28/2012 10:15 AM End time: 11/28/2012 10:20 AM Staffing Anesthesiologist: Lewie Loron R Performed by: anesthesiologist  Preanesthetic Checklist Completed: patient identified, site marked, surgical consent, pre-op evaluation, timeout performed, IV checked, risks and benefits discussed and monitors and equipment checked Spinal Block Patient position: sitting Prep: ChloraPrep Patient monitoring: heart rate, continuous pulse ox and blood pressure Approach: right paramedian Location: L2-3 Injection technique: single-shot Needle Needle type: Quincke  Needle gauge: 22 G Needle length: 9 cm Assessment Sensory level: T6 Additional Notes Expiration date of kit checked and confirmed. Patient tolerated procedure well, without complications.  Very difficult spinal. Severe scoliosis.

## 2012-11-28 NOTE — H&P (Signed)
TOTAL HIP ADMISSION H&P  Patient is admitted for left total hip arthroplasty.  Subjective:  Chief Complaint: left hip pain  HPI: Stacey Clay, 79 y.o. female, has a history of pain and functional disability in the left hip(s) due to arthritis and patient has failed non-surgical conservative treatments for greater than 12 weeks to include NSAID's and/or analgesics, use of assistive devices and activity modification.  Onset of symptoms was gradual starting 7 years ago with gradually worsening course since that time.The patient noted no past surgery on the left hip(s).  Patient currently rates pain in the left hip at 6 out of 10 with activity. Patient has night pain, worsening of pain with activity and weight bearing, trendelenberg gait, pain that interfers with activities of daily living and pain with passive range of motion. Patient has evidence of subchondral cysts, subchondral sclerosis, periarticular osteophytes and joint space narrowing by imaging studies. This condition presents safety issues increasing the risk of falls.  There is no current active infection.  Patient Active Problem List   Diagnosis Date Noted  . Degenerative arthritis of hip 11/28/2012  . DJD (degenerative joint disease) of hip 10/23/2012   Past Medical History  Diagnosis Date  . Contracture of lower leg joint   . Pain in joint, pelvic region and thigh   . Disturbance of skin sensation   . Pain in joint, lower leg   . Restless legs syndrome (RLS)   . Disorder of bone and cartilage, unspecified   . Unspecified cataract     not surgically ready  . Complication of anesthesia   . PONV (postoperative nausea and vomiting)   . Thyrotoxicosis without mention of goiter or other cause, without mention of thyrotoxic crisis or storm     tx. radioactive Iodine s/p yrs after having thyroid goiter surgery  . Arthritis     Osteoarthritis -knees, hips-"pseudo gout right knee" "curvature of spine"  . Gait instability     due to  contractures and curvature to spine uses-walker to ambulate    Past Surgical History  Procedure Laterality Date  . Tonsillectomy  1932  . Goiter resection  1958  . Cholecystectomy      Prescriptions prior to admission  Medication Sig Dispense Refill  . acetaminophen (TYLENOL) 650 MG CR tablet Take 1,300 mg by mouth 2 (two) times daily.      . Multiple Vitamin (MULTIVITAMIN) tablet Take 1 tablet by mouth daily.       Allergies  Allergen Reactions  . Aleve (Naproxen Sodium)     Stomach bleeding  . Aspirin     Stomach bleeding  . Darvocet (Propoxyphene-Acetaminophen)     Stomach bleeding  . Darvon (Propoxyphene Hcl)     Stomach bleeding  . Lomotil (Diphenoxylate)     Stomach bleeding  . Ultram (Tramadol)     Stomach bleeding. Patient does not recall taking this medication.    History  Substance Use Topics  . Smoking status: Never Smoker   . Smokeless tobacco: Not on file  . Alcohol Use: No    History reviewed. No pertinent family history.   Review of Systems  Musculoskeletal: Positive for joint pain.  All other systems reviewed and are negative.    Objective:  Physical Exam  Constitutional: She is oriented to person, place, and time. She appears well-developed and well-nourished.  HENT:  Head: Normocephalic and atraumatic.  Eyes: EOM are normal. Pupils are equal, round, and reactive to light.  Neck: Normal range of motion. Neck  supple.  Cardiovascular: Normal rate and regular rhythm.   Respiratory: Effort normal and breath sounds normal.  GI: Soft. Bowel sounds are normal.  Musculoskeletal:       Left hip: She exhibits decreased range of motion, decreased strength, bony tenderness and crepitus.  Neurological: She is alert and oriented to person, place, and time.  Skin: Skin is warm and dry.  Psychiatric: She has a normal mood and affect.    Vital signs in last 24 hours: Temp:  [97.9 F (36.6 C)] 97.9 F (36.6 C) (05/23 0726) Pulse Rate:  [93] 93 (05/23  0726) Resp:  [18] 18 (05/23 0726) BP: (165)/(85) 165/85 mmHg (05/23 0726) SpO2:  [97 %] 97 % (05/23 0726)  Labs:   Estimated body mass index is 19.80 kg/(m^2) as calculated from the following:   Height as of 11/20/12: 5\' 5"  (1.651 m).   Weight as of 10/23/12: 53.978 kg (119 lb).   Imaging Review Plain radiographs demonstrate severe degenerative joint disease of the left hip(s). The bone quality appears to be fair for age and reported activity level.  Assessment/Plan:  End stage arthritis, left hip(s)  The patient history, physical examination, clinical judgement of the provider and imaging studies are consistent with end stage degenerative joint disease of the left hip(s) and total hip arthroplasty is deemed medically necessary. The treatment options including medical management, injection therapy, arthroscopy and arthroplasty were discussed at length. The risks and benefits of total hip arthroplasty were presented and reviewed. The risks due to aseptic loosening, infection, stiffness, dislocation/subluxation,  thromboembolic complications and other imponderables were discussed.  The patient acknowledged the explanation, agreed to proceed with the plan and consent was signed. Patient is being admitted for inpatient treatment for surgery, pain control, PT, OT, prophylactic antibiotics, VTE prophylaxis, progressive ambulation and ADL's and discharge planning.The patient is planning to be discharged to skilled nursing facility

## 2012-11-28 NOTE — Anesthesia Preprocedure Evaluation (Addendum)
Anesthesia Evaluation  Patient identified by MRN, date of birth, ID band Patient awake    Reviewed: Allergy & Precautions, H&P , NPO status , Patient's Chart, lab work & pertinent test results  History of Anesthesia Complications (+) PONV  Airway Mallampati: I TM Distance: >3 FB Neck ROM: Full    Dental  (+) Dental Advisory Given and Teeth Intact   Pulmonary neg pulmonary ROS,  breath sounds clear to auscultation        Cardiovascular negative cardio ROS  Rhythm:Regular Rate:Normal     Neuro/Psych negative neurological ROS  negative psych ROS   GI/Hepatic negative GI ROS, Neg liver ROS,   Endo/Other  Hyperthyroidism   Renal/GU negative Renal ROS     Musculoskeletal negative musculoskeletal ROS (+)   Abdominal   Peds  Hematology negative hematology ROS (+)   Anesthesia Other Findings   Reproductive/Obstetrics negative OB ROS                          Anesthesia Physical Anesthesia Plan  ASA: III  Anesthesia Plan: Spinal   Post-op Pain Management:    Induction: Intravenous  Airway Management Planned: Nasal Cannula and Simple Face Mask  Additional Equipment:   Intra-op Plan:   Post-operative Plan:   Informed Consent: I have reviewed the patients History and Physical, chart, labs and discussed the procedure including the risks, benefits and alternatives for the proposed anesthesia with the patient or authorized representative who has indicated his/her understanding and acceptance.   Dental advisory given  Plan Discussed with: CRNA  Anesthesia Plan Comments:        Anesthesia Quick Evaluation

## 2012-11-28 NOTE — Anesthesia Postprocedure Evaluation (Signed)
Anesthesia Post Note  Patient: Stacey Clay  Procedure(s) Performed: Procedure(s) (LRB): LEFT TOTAL HIP ARTHROPLASTY ANTERIOR APPROACH (Left)  Anesthesia type: MAC  Patient location: PACU  Post pain: Pain level controlled  Post assessment: Post-op Vital signs reviewed  Last Vitals: BP 140/62  Pulse 79  Temp(Src) 36.3 C (Oral)  Resp 13  SpO2 100%  Post vital signs: Reviewed  Level of consciousness: awake  Complications: No apparent anesthesia complications

## 2012-11-28 NOTE — Transfer of Care (Signed)
Immediate Anesthesia Transfer of Care Note  Patient: Stacey Clay  Procedure(s) Performed: Procedure(s): LEFT TOTAL HIP ARTHROPLASTY ANTERIOR APPROACH (Left)  Patient Location: PACU  Anesthesia Type:Regional and Spinal  Level of Consciousness: awake, alert  and patient cooperative  Airway & Oxygen Therapy: Patient Spontanous Breathing and Patient connected to face mask oxygen  Post-op Assessment: Report given to PACU RN and Post -op Vital signs reviewed and stable  Post vital signs: Reviewed and stable  Complications: No apparent anesthesia complications

## 2012-11-28 NOTE — Brief Op Note (Signed)
11/28/2012  12:12 PM  PATIENT:  Stacey Clay  79 y.o. female  PRE-OPERATIVE DIAGNOSIS:  Severe end-stage arthritis left hip  POST-OPERATIVE DIAGNOSIS:  Severe end-stage arthritis left hip  PROCEDURE:  Procedure(s): LEFT TOTAL HIP ARTHROPLASTY ANTERIOR APPROACH (Left)  SURGEON:  Surgeon(s) and Role:    * Kathryne Hitch, MD - Primary  PHYSICIAN ASSISTANT: Rexene Edison  ANESTHESIA:   spinal  EBL:  Total I/O In: 2000 [I.V.:2000] Out: 825 [Urine:600; Blood:225]  BLOOD ADMINISTERED:none  DRAINS: none   LOCAL MEDICATIONS USED:  NONE  SPECIMEN:  No Specimen  DISPOSITION OF SPECIMEN:  N/A  COUNTS:  YES  TOURNIQUET:  * No tourniquets in log *  DICTATION: .Other Dictation: Dictation Number [id number]  PLAN OF CARE: Admit to inpatient   PATIENT DISPOSITION:  PACU - hemodynamically stable.   Delay start of Pharmacological VTE agent (>24hrs) due to surgical blood loss or risk of bleeding: no

## 2012-11-28 NOTE — Progress Notes (Signed)
Utilization review completed.  

## 2012-11-29 LAB — CBC
HCT: 33.2 % — ABNORMAL LOW (ref 36.0–46.0)
Hemoglobin: 10.6 g/dL — ABNORMAL LOW (ref 12.0–15.0)
WBC: 7.6 10*3/uL (ref 4.0–10.5)

## 2012-11-29 LAB — BASIC METABOLIC PANEL
BUN: 11 mg/dL (ref 6–23)
CO2: 27 mEq/L (ref 19–32)
Chloride: 100 mEq/L (ref 96–112)
Glucose, Bld: 141 mg/dL — ABNORMAL HIGH (ref 70–99)
Potassium: 3.7 mEq/L (ref 3.5–5.1)

## 2012-11-29 MED ORDER — WARFARIN SODIUM 4 MG PO TABS
4.0000 mg | ORAL_TABLET | Freq: Once | ORAL | Status: AC
  Administered 2012-11-29: 4 mg via ORAL
  Filled 2012-11-29: qty 1

## 2012-11-29 NOTE — Evaluation (Signed)
Physical Therapy Evaluation Patient Details Name: Stacey Clay MRN: [id number] DOB: 07-10-1922 Today's Date: 11/29/2012 Time: 1326-1410 PT Time Calculation (min): 44 min  PT Assessment / Plan / Recommendation Clinical Impression  Pt s/p L THR presents with decreased Bil LE ROM with premorbid contractures, decreased L LE strength, and post op pain, nausea and dizziness limiting functional mobility    PT Assessment  Patient needs continued PT services    Follow Up Recommendations  SNF    Does the patient have the potential to tolerate intense rehabilitation      Barriers to Discharge        Equipment Recommendations  None recommended by PT    Recommendations for Other Services OT consult   Frequency 7X/week    Precautions / Restrictions Precautions Precautions: Fall Restrictions Weight Bearing Restrictions: No Other Position/Activity Restrictions: WBAT   Pertinent Vitals/Pain 4/10; premed,       Mobility  Bed Mobility Bed Mobility: Supine to Sit Supine to Sit: 1: +2 Total assist Supine to Sit: Patient Percentage: 50% Details for Bed Mobility Assistance: cues for sequence and use of extremeties to self assist Transfers Transfers: Sit to Stand;Stand to Sit Sit to Stand: 1: +2 Total assist;From chair/3-in-1;From bed;With upper extremity assist Sit to Stand: Patient Percentage: 60% Stand to Sit: 1: +2 Total assist;To bed;To chair/3-in-1;With armrests;With upper extremity assist Stand to Sit: Patient Percentage: 60% Details for Transfer Assistance: cues for transition position and use of UEs to self assist Ambulation/Gait Ambulation/Gait Assistance: 1: +2 Total assist Ambulation/Gait: Patient Percentage: 60% Ambulation Distance (Feet): 5 Feet (twice) Assistive device: Rolling walker Ambulation/Gait Assistance Details: cues for sequence, stride length, posture and position from RW - Pt very ltd in abiltiy to correct posture and states unable to step through with L  LE  prior to surgery 2* ltd ext at R knee Gait Pattern: Decreased step length - left;Shuffle;Trunk flexed;Antalgic General Gait Details: Ltd by onset nausea and dizziness - BP 148/82 Stairs: No    Exercises Total Joint Exercises Ankle Circles/Pumps: AROM;Both;15 reps;Supine Heel Slides: AAROM;15 reps;Supine;Left Hip ABduction/ADduction: AAROM;10 reps;Supine;Left   PT Diagnosis: Difficulty walking  PT Problem List: Decreased strength;Decreased range of motion;Decreased activity tolerance;Decreased mobility;Decreased knowledge of use of DME;Other (comment);Pain PT Treatment Interventions: DME instruction;Gait training;Therapeutic activities;Functional mobility training;Therapeutic exercise;Patient/family education   PT Goals Acute Rehab PT Goals PT Goal Formulation: With patient Time For Goal Achievement: 12/06/12 Potential to Achieve Goals: Good Pt will go Supine/Side to Sit: with supervision PT Goal: Supine/Side to Sit - Progress: Goal set today Pt will go Sit to Supine/Side: with supervision PT Goal: Sit to Supine/Side - Progress: Goal set today Pt will go Sit to Stand: with supervision PT Goal: Sit to Stand - Progress: Goal set today Pt will go Stand to Sit: with supervision PT Goal: Stand to Sit - Progress: Goal set today Pt will Ambulate: 16 - 50 feet;with min assist;with rolling walker PT Goal: Ambulate - Progress: Goal set today  Visit Information  Last PT Received On: 11/29/12 Assistance Needed: +2    Subjective Data  Subjective: I have 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 Patient Stated Goal: Rehab at [facility] and home   Prior Functioning  Communication Communication: No difficulties Dominant Hand: Right    Cognition  Cognition Arousal/Alertness: Awake/alert Behavior During Therapy: WFL for tasks assessed/performed Overall Cognitive Status: Within Functional Limits for tasks assessed    Extremity/Trunk  Assessment Right Upper Extremity  Assessment RUE ROM/Strength/Tone: [facility] for tasks assessed Left Upper Extremity Assessment LUE ROM/Strength/Tone: WFL for tasks assessed Right Lower Extremity Assessment RLE ROM/Strength/Tone: Deficits RLE ROM/Strength/Tone Deficits: Min hip abd, ltd hip ext and knee ext ltd to -30 2* "psuedo gout" Left Lower Extremity Assessment LLE ROM/Strength/Tone: Deficits LLE ROM/Strength/Tone Deficits: Min hip abd, hip flex to 80 with ltd hip ext and knee ext ltd to -15.  Hip strength 3-/5 Trunk Assessment Trunk Assessment: Kyphotic   Balance    End of Session PT - End of Session Equipment Utilized During Treatment: Gait belt Activity Tolerance: Patient tolerated treatment well Patient left: in bed;with call bell/phone within reach;with family/visitor present Nurse Communication: Mobility status;Other (comment) (Dizziness and nausea)  GP     Briselda Naval 11/29/2012, 4:18 PM

## 2012-11-29 NOTE — Progress Notes (Signed)
Subjective: 1 Day Post-Op Procedure(s) (LRB): LEFT TOTAL HIP ARTHROPLASTY ANTERIOR APPROACH (Left) Patient reports pain as moderate.  Nausea this AM.  Objective: Vital signs in last 24 hours: Temp:  [94 F (34.4 C)-98.8 F (37.1 C)] 98.8 F (37.1 C) (05/24 0608) Pulse Rate:  [56-111] 87 (05/24 0959) Resp:  [11-22] 18 (05/24 0800) BP: (99-188)/(46-92) 156/71 mmHg (05/24 0959) SpO2:  [94 %-100 %] 99 % (05/24 0959) Weight:  [53.184 kg (117 lb 4 oz)] 53.184 kg (117 lb 4 oz) (05/23 1448)  Intake/Output from previous day: 05/23 0701 - 05/24 0700 In: 4115 [P.O.:360; I.V.:3650; IV Piggyback:105] Out: 3400 [Urine:3025; Emesis/NG output:150; Blood:225] Intake/Output this shift: Total I/O In: 120 [P.O.:120] Out: -    Recent Labs  11/29/12 0435  HGB 10.6*    Recent Labs  11/29/12 0435  WBC 7.6  RBC 3.40*  HCT 33.2*  PLT 212    Recent Labs  11/29/12 0435  NA 136  K 3.7  CL 100  CO2 27  BUN 11  CREATININE 0.42*  GLUCOSE 141*  CALCIUM 8.6    Recent Labs  11/29/12 0435  INR 1.06   Left lower extremity:  Sensation intact distally Intact pulses distally Dorsiflexion/Plantar flexion intact Incision: dressing C/D/I Compartment soft  Assessment/Plan: 1 Day Post-Op Procedure(s) (LRB): LEFT TOTAL HIP ARTHROPLASTY ANTERIOR APPROACH (Left)  Up with therapy Will leave dressing intact Monitor Hgb for symptoms of anemia Cambrey Lupi 11/29/2012, 11:02 AM

## 2012-11-29 NOTE — Progress Notes (Signed)
OT Cancellation/screen Note  Patient Details Name: Stacey Clay MRN: [id number] DOB: 1922/08/09   Cancelled Treatment:     Pt plans SNF for rehab.  Will defer OT eval to that venue.    Krissie Merrick 11/29/2012, 4:10 PM Marica Otter, OTR/L [phone] 11/29/2012

## 2012-11-29 NOTE — Op Note (Signed)
NAMEHERMELA, HARDT                 ACCOUNT NO.:  [id number]  MEDICAL RECORD NO.:  [id number]  LOCATION:  1617                         FACILITY:  [facility]  PHYSICIAN:  Vanita Panda. Magnus Ivan, M.D.DATE OF BIRTH:  31-Oct-1922  DATE OF PROCEDURE:  11/28/2012 DATE OF DISCHARGE:                              OPERATIVE REPORT   PREOPERATIVE DIAGNOSIS:  Severe end-stage arthritis and degenerative joint disease, left hip.  POSTOPERATIVE DIAGNOSIS:  Severe end-stage arthritis and degenerative joint disease, left hip.  PROCEDURE:  Left total hip arthroplasty through direct anterior approach.  IMPLANTS:  DePuy Sector Gription acetabular component size 48, with apex hole eliminator guide and 1 screw, size 32+ 4 neutral polyethylene liner, Corail femoral component size 12 with varus offset (KLA), size 32+ 1 metal hip ball.  SURGEON:  Vanita Panda. Magnus Ivan, M.D.  ASSISTANT:  Richardean Canal, P.A.C.  ANESTHESIA:  Spinal.  BLOOD LOSS:  250 mL.  ANTIBIOTICS:  2 g IV Ancef.  COMPLICATIONS:  None.  INDICATIONS:  Ms. Cronic is an 79 year old female with severe debilitating arthritis involving her left hip.  It affects her activities of daily living.  Her pain is daily.  Her mobility is severely limiting, the legs significantly short.  X-ray showed __________ severe deformity __________ difficult for her to get around. She has gotten to the point where quality of life is so much of an issue, she wished to proceed with the total hip arthroplasty to improve her mobility and improve her quality of life and decrease her pain.  She understands risks of acute blood loss anemia, nerve/vessel injury, fracture, infection, and DVT.  DESCRIPTION OF PROCEDURE:  Informed consent was obtained.  Appropriate left leg was marked.  She was brought to the operating room and spinal anesthesia was obtained while she was on a stretcher.  Next, a Foley catheter was placed and she was laid supine on the stretcher  and traction boots were placed on her feet.  She was next placed supine on the Hana fracture table with perineal post placed and both legs inline with skeletal traction but no traction applied.  Her left hip was then prepped and draped with DuraPrep and sterile drapes.  Time-out was called, she was identified as correct patient, correct left hip.  We then made incision anterior and posterior to the anterior superior iliac spine and carried this obliquely down the leg.  I dissected down to the tensor fascia lata and the tensor fascia was divided longitudinally.  I then proceeded with direct anterior approach to the hip.  A Cobra retractor was placed around the lateral neck and up underneath the rectus femoris, a medial retractor was placed.  I opened up the joint capsule and placed the Cobra retractors within the joint capsule.  Joint capsule was so scarred down, we had to really remove most of the joint capsule and remove significant osteophytes and overhanging of the femoral neck and acetabulum.  I then used the fluoroscopy and C-arm to identify and verify my neck cut.  I then was able to make my neck cut with an oscillating saw just proximal to the lesser trochanter and completed this with an osteotome.  I placed a  corkscrew guide in the femoral head and removed a very small and significantly severely deformed femoral head.  I then cleaned the acetabulum debris and saw that we had not much room for acetabular component in terms of medializing.  We went from size 42 reamer only up to a size 48 with all reamers placed under direct visualization and direct fluoroscopy so we could obtain optimal positioning of her implant.  I then chose a diffuse Sector Gription acetabular component size 58 and put it under direct visualization and direct fluoroscopy.  We then placed the apex hole eliminator guide and a single screw, and I was pleased that the position was stable.  We placed a real 32+ 4  neutral polyethylene liner. Attention was then turned to the femur with the leg externally rotated to 90 degrees, extended and adducted.  I was able to open the femoral canal with the box cutting guide and a rongeur.  I placed a Bent Hohmann behind the greater trochanter and Mueller retractor medially.  I then began broaching from a size 8 broach from a Corail femoral system from DePuy all the way up to size 12.  With the size 12, we felt femoral canal was stable.  I calcar planed off this and then trialed a varus neck with a new.  Starting off, she was so much short on that side, we were able to gain so much length, so I put the varus neck on that and a standard 32+ 1 hip ball and reduced this in the acetabulum, it was stable with internal and external rotation and appreciated the position of the implants.  I then dislocated the hip and removed the trial components and placed the real Corail femoral component size 12 with varus offset and a real 32+ 1 metal hip ball and again reduced this in the acetabulum, it was stable.  We then copiously irrigated tissues with normal saline solution.  We did not have any joint hip capsule closed, so we closed the tensor fascia with a running #1 Vicryl suture followed by 0 Vicryl in the deep tissue, 2-0 Vicryl in subcutaneous tissue, and Monocryl in subcuticular and Dermabond on the skin.  A well-padded dressing was applied.  She was taken off the Hana table and to the recovery room in stable condition.  All final counts were correct. There were no complications noted.  Of note, Richardean Canal, PA-C, was present in the entire case.  His presence is crucial for exposing the hip, retracting tissues, assistant with implant placement, and closure of the wound.     Vanita Panda. Magnus Ivan, M.D.     CYB/MEDQ  D:  11/28/2012  T:  11/28/2012  Job:  [id number]

## 2012-11-29 NOTE — Care Management Note (Signed)
    Page 1 of 1   11/29/2012     4:14:42 PM   CARE MANAGEMENT NOTE 11/29/2012  Patient:  TAURUS, WILLIS   Account Number:  [id number]  Date Initiated:  11/29/2012  Documentation initiated by:  Lanier Clam  Subjective/Objective Assessment:   ADMITTED W/L HIP ARTHRITIS.     Action/Plan:   FROM HOME ALONE.[facility] ALREADY FOLLOWING FROM DOCTOR'S OFFICE.   Anticipated DC Date:  12/01/2012   Anticipated DC Plan:  SKILLED NURSING FACILITY      DC Planning Services  CM consult      Choice offered to / List presented to:  C-1 Patient           Status of service:  In process, will continue to follow Medicare Important Message given?   (If response is "NO", the following Medicare IM given date fields will be blank) Date Medicare IM given:   Date Additional Medicare IM given:    Discharge Disposition:    Per UR Regulation:    If discussed at Long Length of Stay Meetings, dates discussed:    Comments:  11/29/12 Justina Bertini RN,BSN NCM WEEKEND [phone] POD#1 L THA.AWAIT PT EVAL,& RECOMMENDATIONS.[facility].

## 2012-11-29 NOTE — Progress Notes (Addendum)
ANTICOAGULATION CONSULT NOTE - Follow Up  Pharmacy Consult for Warfarin Indication: VTE prophylaxis  Allergies  Allergen Reactions  . Aleve (Naproxen Sodium)     Stomach bleeding  . Aspirin     Stomach bleeding  . Darvocet (Propoxyphene-Acetaminophen)     Stomach bleeding  . Darvon (Propoxyphene Hcl)     Stomach bleeding  . Lomotil (Diphenoxylate)     Stomach bleeding  . Ultram (Tramadol)     Stomach bleeding. Patient does not recall taking this medication.    Patient Measurements: Height: 5\' 5"  (165.1 cm) Weight: 117 lb 4 oz (53.184 kg) IBW/kg (Calculated) : 57  Vital Signs: Temp: 98.8 F (37.1 C) (05/24 0608) Temp src: Oral (05/24 0227) BP: 150/64 mmHg (05/24 0608) Pulse Rate: 96 (05/24 0608)  Labs:  Recent Labs  11/29/12 0435  HGB 10.6*  HCT 33.2*  PLT 212  LABPROT 13.7  INR 1.06  CREATININE 0.42*    Estimated Creatinine Clearance: 41.6 ml/min (by C-G formula based on Cr of 0.42).   Anticoag or Interacting Medications:  (none)  Assessment: 79 yo F s/p L THA on 11/28/12. Warfarin started for VTE pprophylaxis on 5/23 pm. INR subtherapeutic as expected. Using lower initial dosing of warfarin due to age > 45 y.o. Of note, patient has "allergies" to several meds with reaction listed as "stomach bleeding" - will need to watch closely for signs/symptoms GIB while on warfarin.  Goal of Therapy:  INR 2-3 Monitor platelets by anticoagulation protocol: Yes   Plan:  1)  Repeat Coumadin 4mg  po at 1800 2)  Daily INR 3)  Warfarin education prior to discharge. 4)  Watch for signs/symptoms GIB  Darrol Angel, PharmD Pager: [phone] 11/29/2012,9:07 AM

## 2012-11-30 LAB — CBC
HCT: 32 % — ABNORMAL LOW (ref 36.0–46.0)
Hemoglobin: 10.4 g/dL — ABNORMAL LOW (ref 12.0–15.0)
MCV: 98.5 fL (ref 78.0–100.0)
Platelets: 210 10*3/uL (ref 150–400)
RBC: 3.25 MIL/uL — ABNORMAL LOW (ref 3.87–5.11)
WBC: 9.3 10*3/uL (ref 4.0–10.5)

## 2012-11-30 MED ORDER — WARFARIN SODIUM 4 MG PO TABS
4.0000 mg | ORAL_TABLET | Freq: Once | ORAL | Status: AC
  Administered 2012-11-30: 4 mg via ORAL
  Filled 2012-11-30: qty 1

## 2012-11-30 MED ORDER — TRAMADOL HCL 50 MG PO TABS
100.0000 mg | ORAL_TABLET | Freq: Four times a day (QID) | ORAL | Status: DC | PRN
  Administered 2012-12-02: 100 mg via ORAL
  Filled 2012-11-30: qty 2

## 2012-11-30 NOTE — Clinical Social Work Psychosocial (Signed)
Clinical Social Work Department  BRIEF PSYCHOSOCIAL ASSESSMENT  Patient: Stacey Clay Account Number: [id number]  Admit date: 11/28/12 Clinical Social Worker Sabino Niemann, MSW Date/Time:  Referred by: Physician Date Referred: 11/28/12 Referred for   SNF Placement   Other Referral:  Interview type: Patient  Other interview type: PSYCHOSOCIAL DATA  Living Status: Patient is widowed and lives Alone Admitted from facility:  Level of care:  Primary support name: Stacey Clay Primary support relationship to patient: Son Degree of support available:  Strong and vested  CURRENT CONCERNS  Current Concerns   Post-Acute Placement   Other Concerns:  SOCIAL WORK ASSESSMENT / PLAN  CSW met with pt re: PT recommendation for SNF.   Pt lives alone  CSW explained placement process and answered questions.   Pt reports that she pre-registered at [facility] prior to her surgery  CSW completed FL2 and initiated SNF search.     Assessment/plan status: Information/Referral to [facility]  Other assessment/ plan:  Information/referral to community resources:  SNF   PTAR  PATIENT'S/FAMILY'S RESPONSE TO PLAN OF CARE:  Pt  reports she is agreeable to [facility] SNF in order to increase strength and independence with mobility prior to returning home  Pt verbalized understanding of placement process and appreciation for CSW assist.   Sabino Niemann, MSW [phone]

## 2012-11-30 NOTE — Progress Notes (Signed)
Physical Therapy Treatment Patient Details Name: Stacey Clay MRN: [id number] DOB: Dec 08, 1922 Today's Date: 11/30/2012 Time: 1031-1050 PT Time Calculation (min): 19 min  PT Assessment / Plan / Recommendation Comments on Treatment Session  Pt able to tolerate OOB to chair today, however requires +2 assist to complete some steps and get to chair.  She is noted to have very forward flexed posture and takes increased step lengths on RLE.      Follow Up Recommendations  SNF     Does the patient have the potential to tolerate intense rehabilitation     Barriers to Discharge        Equipment Recommendations  None recommended by PT    Recommendations for Other Services    Frequency 7X/week   Plan Discharge plan remains appropriate    Precautions / Restrictions Precautions Precautions: Fall Restrictions Weight Bearing Restrictions: No Other Position/Activity Restrictions: WBAT   Pertinent Vitals/Pain 4/10    Mobility  Bed Mobility Bed Mobility: Supine to Sit Supine to Sit: 1: +2 Total assist Supine to Sit: Patient Percentage: 50% Details for Bed Mobility Assistance: Assist for LEs to and off EOB and also for trunk with cues for hand placement and to self assist.  She was able to do well scooting hips to EOB.  Transfers Transfers: Sit to Stand;Stand to Sit Sit to Stand: 1: +2 Total assist;From chair/3-in-1;From bed;With upper extremity assist Sit to Stand: Patient Percentage: 50% Stand to Sit: 1: +2 Total assist;To bed;To chair/3-in-1;With armrests;With upper extremity assist Stand to Sit: Patient Percentage: 60% Details for Transfer Assistance: Assist to rise, steady and maintain upright stance with cues for hand placement and LE positioning when getting ready to stand.   Ambulation/Gait Ambulation/Gait Assistance: 1: +2 Total assist Ambulation/Gait: Patient Percentage: 40% Ambulation Distance (Feet): 7 Feet Assistive device: Rolling walker Ambulation/Gait Assistance  Details: Assist to maintain upright posture throughout with max cues for sequencing/technique with RW and to maintain as upright posture as she could.  Pt tends to step with R leg first and keep it far ahead of LLE.  Gait Pattern: Decreased step length - left;Shuffle;Trunk flexed;Antalgic Gait velocity: decreased    Exercises Total Joint Exercises Ankle Circles/Pumps: AROM;Both;10 reps Quad Sets: AROM;Left;10 reps   PT Diagnosis:    PT Problem List:   PT Treatment Interventions:     PT Goals Acute Rehab PT Goals PT Goal Formulation: With patient Time For Goal Achievement: 12/06/12 Potential to Achieve Goals: Good Pt will go Supine/Side to Sit: with supervision PT Goal: Supine/Side to Sit - Progress: Progressing toward goal Pt will go Sit to Stand: with supervision PT Goal: Sit to Stand - Progress: Progressing toward goal Pt will go Stand to Sit: with supervision PT Goal: Stand to Sit - Progress: Progressing toward goal Pt will Ambulate: 16 - 50 feet;with min assist;with rolling walker PT Goal: Ambulate - Progress: Progressing toward goal  Visit Information  Last PT Received On: 11/30/12 Assistance Needed: +2    Subjective Data  Subjective: I have a lot of issues with this right leg.  Patient Stated Goal: Rehab at [place] and home   Cognition  Cognition Arousal/Alertness: Awake/alert Behavior During Therapy: WFL for tasks assessed/performed Overall Cognitive Status: Within Functional Limits for tasks assessed    Balance     End of Session PT - End of Session Equipment Utilized During Treatment: Gait belt Activity Tolerance: Patient limited by fatigue Patient left: in chair;with call bell/phone within reach Nurse Communication: Mobility status   GP  Vista Deck 11/30/2012, 11:55 AM

## 2012-11-30 NOTE — Clinical Social Work Placement (Signed)
Clinical Social Work Department  CLINICAL SOCIAL WORK PLACEMENT NOTE  11/30/2012  Patient: Cecia Egge CrewsAccount [id number] Admit date: 11/28/12  Clinical Social Worker: Sabino Niemann MSWDate/time: 11/30/2012 11:30 AM  Clinical Social Work is seeking post-discharge placement for this patient at the following level of care: SKILLED NURSING (*CSW will update this form in Epic as items are completed)  11/30/2012 Patient/family provided with [facility] Department of Clinical Social Work's list of facilities offering this level of care within the geographic area requested by the patient (or if unable, by the patient's family)  11/30/2012 Patient/family informed of their freedom to choose among providers that offer the needed level of care, that participate in Medicare, Medicaid or managed care program needed by the patient, have an available bed and are willing to accept the patient.  11/30/2012 Patient/family informed of [facility]' ownership interest in [facility], as well as of the fact that they are under no obligation to receive care at this facility.  PASARR submitted to EDS on 11/30/2012  PASARR number received from EDS on 11/30/2012  FL2 transmitted to all facilities in geographic area requested by pt/family on 11/30/2012  FL2 transmitted to all facilities within larger geographic area on  Patient informed that his/her managed care company has contracts with or will negotiate with certain facilities, including the following:  Patient/family informed of bed offers received:  Patient chooses bed at  Physician recommends and patient chooses bed at  Patient to be transferred to on  Patient to be transferred to facility by  The following physician request were entered in Epic:  Additional Comments:

## 2012-11-30 NOTE — Progress Notes (Addendum)
ANTICOAGULATION CONSULT NOTE - Follow Up  Pharmacy Consult for Warfarin Indication: VTE prophylaxis  Allergies  Allergen Reactions  . Aleve (Naproxen Sodium)     Stomach bleeding  . Aspirin     Stomach bleeding  . Darvocet (Propoxyphene-Acetaminophen)     Stomach bleeding  . Darvon (Propoxyphene Hcl)     Stomach bleeding  . Lomotil (Diphenoxylate)     Stomach bleeding  . Ultram (Tramadol)     Stomach bleeding. Patient does not recall taking this medication.    Patient Measurements: Height: 5\' 5"  (165.1 cm) Weight: 117 lb 4 oz (53.184 kg) IBW/kg (Calculated) : 57   Labs:  Recent Labs  11/29/12 0435 11/30/12 0539  HGB 10.6* 10.4*  HCT 33.2* 32.0*  PLT 212 210  LABPROT 13.7 14.6  INR 1.06 1.16  CREATININE 0.42*  --     Estimated Creatinine Clearance: 41.6 ml/min (by C-G formula based on Cr of 0.42).   Anticoag or Interacting Medications:  Warfarin 4mg , 4mg   Assessment: 79 yo F s/p L THA on 11/28/12. Warfarin started for VTE pprophylaxis on 5/23 pm. INR subtherapeutic as expected after only 2 doses. Using lower initial dosing of warfarin due to age > 6 y.o. Of note, patient has "allergies" to several meds with reaction listed as "stomach bleeding" - will need to watch closely for signs/symptoms GIB while on warfarin. No bleeding noted in chart notes.  Goal of Therapy:  INR 2-3 Monitor platelets by anticoagulation protocol: Yes   Plan:  1)  Repeat Coumadin 4mg  po at 1800 2)  Daily INR 3)  Warfarin education prior to discharge. 4)  Watch for signs/symptoms GIB  Darrol Angel, PharmD Pager: [phone] 11/30/2012,9:22 AM   ADDENDUM: Standard warfarin education completed with patient with son and daughter-in-law present. Patient mentioned she had been on warfarin in the past. All questions answered. Patient expressed verbal understanding.  Darrol Angel, PharmD Pager: [phone] 11/30/2012 5:10 PM

## 2012-11-30 NOTE — Progress Notes (Signed)
Subjective: 2 Days Post-Op Procedure(s) (LRB): LEFT TOTAL HIP ARTHROPLASTY ANTERIOR APPROACH (Left) Patient reports pain as mild.  Very slow progress with therapy. Some due to pain meds being too strong.  Objective: Vital signs in last 24 hours: Temp:  [98.2 F (36.8 C)-99 F (37.2 C)] 99 F (37.2 C) (05/25 0630) Pulse Rate:  [82-106] 100 (05/25 0630) Resp:  [16-18] 16 (05/25 0630) BP: (148-160)/(68-72) 160/70 mmHg (05/25 0630) SpO2:  [95 %-99 %] 97 % (05/25 0630)  Intake/Output from previous day: 05/24 0701 - 05/25 0700 In: 2520 [P.O.:720; I.V.:1800] Out: 775 [Urine:775] Intake/Output this shift: Total I/O In: 60 [P.O.:60] Out: 300 [Urine:300]   Recent Labs  11/29/12 0435 11/30/12 0539  HGB 10.6* 10.4*    Recent Labs  11/29/12 0435 11/30/12 0539  WBC 7.6 9.3  RBC 3.40* 3.25*  HCT 33.2* 32.0*  PLT 212 210    Recent Labs  11/29/12 0435  NA 136  K 3.7  CL 100  CO2 27  BUN 11  CREATININE 0.42*  GLUCOSE 141*  CALCIUM 8.6    Recent Labs  11/29/12 0435 11/30/12 0539  INR 1.06 1.16    Sensation intact distally Intact pulses distally Dorsiflexion/Plantar flexion intact Incision: dressing C/D/I  Assessment/Plan: 2 Days Post-Op Procedure(s) (LRB): LEFT TOTAL HIP ARTHROPLASTY ANTERIOR APPROACH (Left) Discharge to SNF in the next 2 days  Gabbrielle Mcnicholas Y 11/30/2012, 10:26 AM

## 2012-12-01 LAB — CBC
Hemoglobin: 9.6 g/dL — ABNORMAL LOW (ref 12.0–15.0)
Platelets: 210 10*3/uL (ref 150–400)
RBC: 3.02 MIL/uL — ABNORMAL LOW (ref 3.87–5.11)
WBC: 9.7 10*3/uL (ref 4.0–10.5)

## 2012-12-01 LAB — PROTIME-INR
INR: 1.25 (ref 0.00–1.49)
Prothrombin Time: 15.5 seconds — ABNORMAL HIGH (ref 11.6–15.2)

## 2012-12-01 MED ORDER — WARFARIN SODIUM 4 MG PO TABS
4.0000 mg | ORAL_TABLET | Freq: Once | ORAL | Status: AC
  Administered 2012-12-01: 4 mg via ORAL
  Filled 2012-12-01: qty 1

## 2012-12-01 NOTE — Progress Notes (Signed)
ANTICOAGULATION CONSULT NOTE - Follow Up  Pharmacy Consult for Warfarin Indication: VTE prophylaxis  Allergies  Allergen Reactions  . Aleve (Naproxen Sodium)     Stomach bleeding  . Aspirin     Stomach bleeding  . Darvocet (Propoxyphene-Acetaminophen)     Stomach bleeding  . Darvon (Propoxyphene Hcl)     Stomach bleeding  . Lomotil (Diphenoxylate)     Stomach bleeding  . Ultram (Tramadol)     Stomach bleeding. Patient does not recall taking this medication.    Patient Measurements: Height: 5\' 5"  (165.1 cm) Weight: 117 lb 4 oz (53.184 kg) IBW/kg (Calculated) : 57   Labs:  Recent Labs  11/29/12 0435 11/30/12 0539 12/01/12 0410  HGB 10.6* 10.4* 9.6*  HCT 33.2* 32.0* 29.4*  PLT 212 210 210  LABPROT 13.7 14.6 15.5*  INR 1.06 1.16 1.25  CREATININE 0.42*  --   --     Estimated Creatinine Clearance: 41.6 ml/min (by C-G formula based on Cr of 0.42).   Anticoag or Interacting Medications:  Warfarin 4mg , 4mg , 4mg   Assessment: 79 yo F s/p L THA on 11/28/12. Warfarin started for VTE pprophylaxis on 5/23 pm.  INR subtherapeutic as expected after 3 doses and may be starting to respond now. Using lower initial dosing of warfarin due to age > 27 y.o.   Of note, patient has "allergies" to several meds with reaction listed as "stomach bleeding" - will need to watch closely for signs/symptoms GIB while on warfarin.   No bleeding noted in chart notes.  CBC ok  Goal of Therapy:  INR 2-3 Monitor platelets by anticoagulation protocol: Yes   Plan:  1)  Repeat Coumadin 4mg  po at 1800, expect INR to continue to rise tomorrow 2)  Daily INR 3)  Warfarin education completed 4)  Watch for signs/symptoms GIB   Hessie Knows, PharmD, BCPS Pager [phone] 12/01/2012 7:38 AM

## 2012-12-01 NOTE — Progress Notes (Signed)
Subjective: 3 Days Post-Op Procedure(s) (LRB): LEFT TOTAL HIP ARTHROPLASTY ANTERIOR APPROACH (Left) Patient reports pain as mild.    Objective: Vital signs in last 24 hours: Temp:  [98.4 F (36.9 C)-98.9 F (37.2 C)] 98.4 F (36.9 C) (05/26 0500) Pulse Rate:  [95-105] 95 (05/26 0500) Resp:  [16] 16 (05/26 0500) BP: (122-148)/(53-73) 136/62 mmHg (05/26 0500) SpO2:  [95 %-99 %] 95 % (05/26 0500)  Intake/Output from previous day: 05/25 0701 - 05/26 0700 In: 300 [P.O.:300] Out: 910 [Urine:910] Intake/Output this shift: Total I/O In: -  Out: 20 [Urine:20]   Recent Labs  11/29/12 0435 11/30/12 0539 12/01/12 0410  HGB 10.6* 10.4* 9.6*    Recent Labs  11/30/12 0539 12/01/12 0410  WBC 9.3 9.7  RBC 3.25* 3.02*  HCT 32.0* 29.4*  PLT 210 210    Recent Labs  11/29/12 0435  NA 136  K 3.7  CL 100  CO2 27  BUN 11  CREATININE 0.42*  GLUCOSE 141*  CALCIUM 8.6    Recent Labs  11/30/12 0539 12/01/12 0410  INR 1.16 1.25    Sensation intact distally Intact pulses distally Dorsiflexion/Plantar flexion intact Incision: dressing C/D/I  Assessment/Plan: 3 Days Post-Op Procedure(s) (LRB): LEFT TOTAL HIP ARTHROPLASTY ANTERIOR APPROACH (Left) Discharge to SNF tomorrow.  BLACKMAN,CHRISTOPHER Y 12/01/2012, 10:28 AM

## 2012-12-01 NOTE — Progress Notes (Signed)
Physical Therapy Treatment Patient Details Name: Stacey Clay MRN: [id number] DOB: 1923/07/05 Today's Date: 12/01/2012 Time: 7829-5621 PT Time Calculation (min): 15 min  PT Assessment / Plan / Recommendation Comments on Treatment Session  Pt able to tolerate somewhat increased distance today with ambulation, however near end of ambulation pt very heavily leaning on R side and was assisted to chair.      Follow Up Recommendations  SNF     Does the patient have the potential to tolerate intense rehabilitation     Barriers to Discharge        Equipment Recommendations  None recommended by PT    Recommendations for Other Services    Frequency 7X/week   Plan Discharge plan remains appropriate    Precautions / Restrictions Precautions Precautions: Fall Restrictions Weight Bearing Restrictions: No Other Position/Activity Restrictions: WBAT   Pertinent Vitals/Pain 4/10 pain    Mobility  Bed Mobility Bed Mobility: Supine to Sit Supine to Sit: 1: +2 Total assist Supine to Sit: Patient Percentage: 40% Details for Bed Mobility Assistance: Assist for LEs to and off EOB and also for trunk with cues for hand placement and to self assist.  She did not do as well today scooting hips to EOB.  Note that she is always on R side when PT arrives and LLE is very internally rotated and tight when she tries to move it.   Transfers Transfers: Sit to Stand;Stand to Sit Sit to Stand: 1: +2 Total assist;From chair/3-in-1;From bed;With upper extremity assist Sit to Stand: Patient Percentage: 60% Stand to Sit: 1: +2 Total assist;To bed;To chair/3-in-1;With armrests;With upper extremity assist Stand to Sit: Patient Percentage: 60% Details for Transfer Assistance: Assist to rise, steady and maintain upright stance with cues for hand placement and LE positioning when getting ready to stand.   Ambulation/Gait Ambulation/Gait Assistance: 1: +2 Total assist Ambulation/Gait: Patient Percentage:  40% Ambulation Distance (Feet): 12 Feet Assistive device: Rolling walker Ambulation/Gait Assistance Details: Pt continues to require increased assist to maintain upright stance throughout and to assist with advancing LLE.  Max cues for sequencing/technique with RW and to maintain as upright posture as possible.  Gait Pattern: Decreased step length - left;Shuffle;Trunk flexed;Antalgic Gait velocity: decreased    Exercises     PT Diagnosis:    PT Problem List:   PT Treatment Interventions:     PT Goals Acute Rehab PT Goals PT Goal Formulation: With patient Time For Goal Achievement: 12/06/12 Potential to Achieve Goals: Good Pt will go Supine/Side to Sit: with supervision PT Goal: Supine/Side to Sit - Progress: Progressing toward goal Pt will go Sit to Stand: with supervision PT Goal: Sit to Stand - Progress: Progressing toward goal Pt will go Stand to Sit: with supervision PT Goal: Stand to Sit - Progress: Progressing toward goal Pt will Ambulate: 16 - 50 feet;with min assist;with rolling walker PT Goal: Ambulate - Progress: Progressing toward goal  Visit Information  Last PT Received On: 12/01/12 Assistance Needed: +2    Subjective Data  Subjective: I'm okay until I move Patient Stated Goal: Rehab at [facility] and home   Cognition  Cognition Arousal/Alertness: Awake/alert Behavior During Therapy: WFL for tasks assessed/performed Overall Cognitive Status: Within Functional Limits for tasks assessed    Balance     End of Session PT - End of Session Equipment Utilized During Treatment: Gait belt Activity Tolerance: Patient limited by fatigue Patient left: in chair;with call bell/phone within reach Nurse Communication: Mobility status   GP  Vista Deck 12/01/2012, 11:19 AM

## 2012-12-01 NOTE — Progress Notes (Signed)
CSW assisting with d/c planning. [facility] contacted and able to accept pt tomorrow for [facility]. Pt has been updated. CSW will follow to assist with d/c planning to SNF.  Cori Razor LCSW [phone]

## 2012-12-02 ENCOUNTER — Encounter (HOSPITAL_COMMUNITY): Payer: Self-pay | Admitting: Orthopaedic Surgery

## 2012-12-02 DIAGNOSIS — R030 Elevated blood-pressure reading, without diagnosis of hypertension: Secondary | ICD-10-CM | POA: Diagnosis not present

## 2012-12-02 DIAGNOSIS — M169 Osteoarthritis of hip, unspecified: Secondary | ICD-10-CM | POA: Diagnosis not present

## 2012-12-02 DIAGNOSIS — M25559 Pain in unspecified hip: Secondary | ICD-10-CM | POA: Diagnosis not present

## 2012-12-02 DIAGNOSIS — R269 Unspecified abnormalities of gait and mobility: Secondary | ICD-10-CM | POA: Diagnosis not present

## 2012-12-02 DIAGNOSIS — M24569 Contracture, unspecified knee: Secondary | ICD-10-CM | POA: Diagnosis not present

## 2012-12-02 DIAGNOSIS — E162 Hypoglycemia, unspecified: Secondary | ICD-10-CM | POA: Diagnosis not present

## 2012-12-02 DIAGNOSIS — M949 Disorder of cartilage, unspecified: Secondary | ICD-10-CM | POA: Diagnosis not present

## 2012-12-02 DIAGNOSIS — G2581 Restless legs syndrome: Secondary | ICD-10-CM | POA: Diagnosis not present

## 2012-12-02 DIAGNOSIS — Z7901 Long term (current) use of anticoagulants: Secondary | ICD-10-CM | POA: Diagnosis not present

## 2012-12-02 DIAGNOSIS — Z471 Aftercare following joint replacement surgery: Secondary | ICD-10-CM | POA: Diagnosis not present

## 2012-12-02 DIAGNOSIS — Z96649 Presence of unspecified artificial hip joint: Secondary | ICD-10-CM | POA: Diagnosis not present

## 2012-12-02 DIAGNOSIS — S72009A Fracture of unspecified part of neck of unspecified femur, initial encounter for closed fracture: Secondary | ICD-10-CM | POA: Diagnosis not present

## 2012-12-02 DIAGNOSIS — D62 Acute posthemorrhagic anemia: Secondary | ICD-10-CM | POA: Diagnosis not present

## 2012-12-02 DIAGNOSIS — H8110 Benign paroxysmal vertigo, unspecified ear: Secondary | ICD-10-CM | POA: Diagnosis not present

## 2012-12-02 DIAGNOSIS — M1990 Unspecified osteoarthritis, unspecified site: Secondary | ICD-10-CM | POA: Diagnosis not present

## 2012-12-02 DIAGNOSIS — R293 Abnormal posture: Secondary | ICD-10-CM | POA: Diagnosis not present

## 2012-12-02 DIAGNOSIS — M6281 Muscle weakness (generalized): Secondary | ICD-10-CM | POA: Diagnosis not present

## 2012-12-02 LAB — PROTIME-INR
INR: 1.28 (ref 0.00–1.49)
Prothrombin Time: 15.7 seconds — ABNORMAL HIGH (ref 11.6–15.2)

## 2012-12-02 MED ORDER — WARFARIN SODIUM 5 MG PO TABS: 5.0000 mg | ORAL_TABLET | Freq: Once | ORAL | Status: DC

## 2012-12-02 MED ORDER — WARFARIN SODIUM 5 MG PO TABS
5.0000 mg | ORAL_TABLET | Freq: Once | ORAL | Status: DC
  Filled 2012-12-02: qty 1

## 2012-12-02 MED ORDER — PROMETHAZINE HCL 25 MG/ML IJ SOLN
12.5000 mg | Freq: Once | INTRAMUSCULAR | Status: AC
  Administered 2012-12-02: 12.5 mg via INTRAVENOUS
  Filled 2012-12-02: qty 1

## 2012-12-02 MED ORDER — HYDROCODONE-ACETAMINOPHEN 5-325 MG PO TABS: 1.0000 | ORAL_TABLET | ORAL | Status: DC | PRN

## 2012-12-02 NOTE — Progress Notes (Signed)
Pt to be d/c to [facility] today. Unable to reach pt's son to provide d/c update . [facility] contacted for transport.  Cori Razor LCSW [phone]

## 2012-12-02 NOTE — Discharge Summary (Signed)
Patient ID: Stacey Clay MRN: [id number] DOB/AGE: [birth year] 77 y.o.  Admit date: 11/28/2012 Discharge date: 12/02/2012  Admission Diagnoses:  Principal Problem:   Degenerative arthritis of hip   Discharge Diagnoses:  Same  Past Medical History  Diagnosis Date  . Contracture of lower leg joint   . Pain in joint, pelvic region and thigh   . Disturbance of skin sensation   . Pain in joint, lower leg   . Restless legs syndrome (RLS)   . Disorder of bone and cartilage, unspecified   . Unspecified cataract     not surgically ready  . Complication of anesthesia   . PONV (postoperative nausea and vomiting)   . Thyrotoxicosis without mention of goiter or other cause, without mention of thyrotoxic crisis or storm     tx. radioactive Iodine s/p yrs after having thyroid goiter surgery  . Arthritis     Osteoarthritis -knees, hips-"pseudo gout right knee" "curvature of spine"  . Gait instability     due to contractures and curvature to spine uses-walker to ambulate  . Hypoglycemia, unspecified   . Hypoglycemia     Surgeries: Procedure(s): LEFT TOTAL HIP ARTHROPLASTY ANTERIOR APPROACH on 11/28/2012   Consultants:    Discharged Condition: Improved  Hospital Course: Stacey Clay is an 79 y.o. female who was admitted 11/28/2012 for operative treatment ofDegenerative arthritis of hip. Patient has severe unremitting pain that affects sleep, daily activities, and work/hobbies. After pre-op clearance the patient was taken to the operating room on 11/28/2012 and underwent  Procedure(s): LEFT TOTAL HIP ARTHROPLASTY ANTERIOR APPROACH.    Patient was given perioperative antibiotics: Anti-infectives   Start     Dose/Rate Route Frequency Ordered Stop   11/28/12 1600  ceFAZolin (ANCEF) IVPB 1 g/50 mL premix     1 g 100 mL/hr over 30 Minutes Intravenous Every 6 hours 11/28/12 1505 11/28/12 2326   11/28/12 0707  ceFAZolin (ANCEF) IVPB 2 g/50 mL premix     2 g 100 mL/hr over 30 Minutes  Intravenous On call to O.R. 11/28/12 0707 11/28/12 1006       Patient was given sequential compression devices, early ambulation, and chemoprophylaxis to prevent DVT.  Patient benefited maximally from hospital stay and there were no complications.    Recent vital signs: Patient Vitals for the past 24 hrs:  BP Temp Temp src Pulse Resp SpO2  12/02/12 0629 147/74 mmHg 98.3 F (36.8 C) Oral 75 16 94 %  12/02/12 0208 - - - 99 - 96 %  12/01/12 2211 133/64 mmHg 99.1 F (37.3 C) Oral 106 16 95 %  12/01/12 1920 - - - - 16 -  12/01/12 1424 139/65 mmHg 98.8 F (37.1 C) Oral 97 16 99 %     Recent laboratory studies:  Recent Labs  11/30/12 0539 12/01/12 0410 12/02/12 0410  WBC 9.3 9.7  --   HGB 10.4* 9.6*  --   HCT 32.0* 29.4*  --   PLT 210 210  --   INR 1.16 1.25 1.28     Discharge Medications:     Medication List    TAKE these medications       acetaminophen 650 MG CR tablet  Commonly known as:  TYLENOL  Take 1,300 mg by mouth 2 (two) times daily.     HYDROcodone-acetaminophen 5-325 MG per tablet  Commonly known as:  NORCO/VICODIN  Take 1-2 tablets by mouth every 4 (four) hours as needed.     multivitamin tablet  Take 1 tablet  by mouth daily.     warfarin 5 MG tablet  Commonly known as:  COUMADIN  Take 1 tablet (5 mg total) by mouth one time only at 6 PM.        Diagnostic Studies: Dg Hip Complete Left  11/28/2012   *RADIOLOGY REPORT*  Clinical Data: Intraoperative graft, left total hip replacement  LEFT HIP - COMPLETE 2+ VIEW  Comparison: Prior radiographs of the pelvis and left hip 09/16/2012  Findings: A total of three intraoperative spot radiographs demonstrate interval left total hip arthroplasty.  Alignment appears near anatomic.  No evidence of immediate hardware complication.  Expected soft tissue changes including subcutaneous emphysema.  Incompletely imaged advanced right hip osteoarthritis.  IMPRESSION: Left total hip arthroplasty without immediate  complication as above.   Original Report Authenticated By: Malachy Moan, M.D.   Dg Pelvis Portable  11/28/2012   *RADIOLOGY REPORT*  Clinical Data: Postop left hip replacement  PORTABLE PELVIS  Comparison: 09/16/2012  Findings: Frontal view the pelvis shows the patient be status post left total hip replacement.  No evidence for immediate hardware complications.  The advanced generative change are noted in the right hip.  Bones are diffusely demineralized.  IMPRESSION: Status post left total hip replacement without evidence for immediate hardware complications.   Original Report Authenticated By: Kennith Center, M.D.   Dg Hip Portable 1 View Left  11/28/2012   *RADIOLOGY REPORT*  Clinical Data: Left total hip replacement  PORTABLE LEFT HIP - 1 VIEW  Comparison: Intraoperative spot fluoro films of earlier the same day  Findings: Portable cross-table film at 1313 hours shows the femoral component to be located within the acetabular cup.  Gas in the overlying soft tissues is compatible with immediate postoperative state.  IMPRESSION: No evidence for immediate hardware complications.   Original Report Authenticated By: Kennith Center, M.D.   Dg C-arm 61-120 Min-no Report  11/28/2012   CLINICAL DATA: intra op   C-ARM 61-120 MINUTES  Fluoroscopy was utilized by the requesting physician.  No radiographic  interpretation.     Disposition: to skilled nursing facility      Discharge Orders   Future Orders Complete By Expires     Call MD / Call 911  As directed     Comments:      If you experience chest pain or shortness of breath, CALL 911 and be transported to the hospital emergency room.  If you develope a fever above 101 F, pus (white drainage) or increased drainage or redness at the wound, or calf pain, call your surgeon's office.    Constipation Prevention  As directed     Comments:      Drink plenty of fluids.  Prune juice may be helpful.  You may use a stool softener, such as Colace (over the  counter) 100 mg twice a day.  Use MiraLax (over the counter) for constipation as needed.    Diet - low sodium heart healthy  As directed     Discharge patient  As directed     Increase activity slowly as tolerated  As directed        Follow-up Information   Follow up with Kathryne Hitch, MD In 2 weeks.   Contact information:   [address] [place] [address] [phone]        Signed: Kathryne Hitch 12/02/2012, 7:37 AM

## 2012-12-02 NOTE — Progress Notes (Signed)
Subjective: 4 Days Post-Op Procedure(s) (LRB): LEFT TOTAL HIP ARTHROPLASTY ANTERIOR APPROACH (Left) Patient reports pain as mild.    Objective: Vital signs in last 24 hours: Temp:  [98.3 F (36.8 C)-99.1 F (37.3 C)] 98.3 F (36.8 C) (05/27 0629) Pulse Rate:  [75-106] 75 (05/27 0629) Resp:  [16] 16 (05/27 0629) BP: (133-147)/(64-74) 147/74 mmHg (05/27 0629) SpO2:  [94 %-99 %] 94 % (05/27 0629)  Intake/Output from previous day: 05/26 0701 - 05/27 0700 In: 480 [P.O.:480] Out: 445 [Urine:445] Intake/Output this shift:     Recent Labs  11/30/12 0539 12/01/12 0410  HGB 10.4* 9.6*    Recent Labs  11/30/12 0539 12/01/12 0410  WBC 9.3 9.7  RBC 3.25* 3.02*  HCT 32.0* 29.4*  PLT 210 210   No results found for this basename: NA, K, CL, CO2, BUN, CREATININE, GLUCOSE, CALCIUM,  in the last 72 hours  Recent Labs  12/01/12 0410 12/02/12 0410  INR 1.25 1.28    Sensation intact distally Intact pulses distally Dorsiflexion/Plantar flexion intact Incision: dressing C/D/I No cellulitis present Compartment soft  Assessment/Plan: 4 Days Post-Op Procedure(s) (LRB): LEFT TOTAL HIP ARTHROPLASTY ANTERIOR APPROACH (Left) Discharge to SNF  Saddie Sandeen Y 12/02/2012, 7:34 AM

## 2012-12-02 NOTE — Progress Notes (Signed)
ANTICOAGULATION CONSULT NOTE - Follow Up  Pharmacy Consult for Warfarin Indication: VTE prophylaxis  Allergies  Allergen Reactions  . Aleve (Naproxen Sodium)     Stomach bleeding  . Aspirin     Stomach bleeding  . Darvocet (Propoxyphene-Acetaminophen)     Stomach bleeding  . Darvon (Propoxyphene Hcl)     Stomach bleeding  . Lomotil (Diphenoxylate)     Stomach bleeding  . Ultram (Tramadol)     Stomach bleeding. Patient does not recall taking this medication.    Patient Measurements: Height: 5\' 5"  (165.1 cm) Weight: 117 lb 4 oz (53.184 kg) IBW/kg (Calculated) : 57   Labs:  Recent Labs  11/30/12 0539 12/01/12 0410 12/02/12 0410  HGB 10.4* 9.6*  --   HCT 32.0* 29.4*  --   PLT 210 210  --   LABPROT 14.6 15.5* 15.7*  INR 1.16 1.25 1.28    Estimated Creatinine Clearance: 41.6 ml/min (by C-G formula based on Cr of 0.42).   Anticoag or Interacting Medications:  Warfarin 4mg , 4mg , 4mg , 4mg   Assessment: 79 yo F s/p L THA on 11/28/12. Warfarin started for VTE pprophylaxis on 5/23 pm.  INR subtherapeutic after 4 doses of 4mg . Initially used lower dosing of warfarin due to age > 56 y.o.   Of note, patient has "allergies" to several meds with reaction listed as "stomach bleeding" - will need to watch closely for signs/symptoms GIB while on warfarin.   No bleeding noted in chart notes.  CBC ok  Goal of Therapy:  INR 2-3 Monitor platelets by anticoagulation protocol: Yes   Plan:   Increase warfarin 5mg  today  If transfers to SNF today, recommend daily INR until therapeutic  Loralee Pacas, PharmD, BCPS Pager: [phone] 12/02/2012 7:21 AM

## 2012-12-03 ENCOUNTER — Non-Acute Institutional Stay (SKILLED_NURSING_FACILITY): Payer: Medicare Other | Admitting: Internal Medicine

## 2012-12-03 DIAGNOSIS — H8110 Benign paroxysmal vertigo, unspecified ear: Secondary | ICD-10-CM

## 2012-12-03 DIAGNOSIS — M169 Osteoarthritis of hip, unspecified: Secondary | ICD-10-CM | POA: Diagnosis not present

## 2012-12-03 DIAGNOSIS — D62 Acute posthemorrhagic anemia: Secondary | ICD-10-CM

## 2012-12-03 DIAGNOSIS — R030 Elevated blood-pressure reading, without diagnosis of hypertension: Secondary | ICD-10-CM

## 2012-12-24 ENCOUNTER — Non-Acute Institutional Stay (SKILLED_NURSING_FACILITY): Payer: Medicare Other | Admitting: Adult Health

## 2012-12-24 DIAGNOSIS — M1610 Unilateral primary osteoarthritis, unspecified hip: Secondary | ICD-10-CM

## 2012-12-24 DIAGNOSIS — Z7901 Long term (current) use of anticoagulants: Secondary | ICD-10-CM

## 2012-12-24 DIAGNOSIS — M169 Osteoarthritis of hip, unspecified: Secondary | ICD-10-CM

## 2012-12-24 DIAGNOSIS — H8110 Benign paroxysmal vertigo, unspecified ear: Secondary | ICD-10-CM | POA: Diagnosis not present

## 2012-12-24 DIAGNOSIS — D62 Acute posthemorrhagic anemia: Secondary | ICD-10-CM | POA: Diagnosis not present

## 2012-12-25 DIAGNOSIS — H8110 Benign paroxysmal vertigo, unspecified ear: Secondary | ICD-10-CM | POA: Insufficient documentation

## 2012-12-25 DIAGNOSIS — D62 Acute posthemorrhagic anemia: Secondary | ICD-10-CM | POA: Insufficient documentation

## 2012-12-25 NOTE — Progress Notes (Signed)
Patient ID: Stacey Clay, female   DOB: Nov 10, 1922, 79 y.o.   MRN: [id number]        HISTORY & PHYSICAL  DATE: 12/03/2012   FACILITY: [facility]  LEVEL OF CARE: SNF (31)  ALLERGIES:  Allergies  Allergen Reactions  . Aleve (Naproxen Sodium)     Stomach bleeding  . Aspirin     Stomach bleeding  . Darvocet (Propoxyphene-Acetaminophen)     Stomach bleeding  . Darvon (Propoxyphene Hcl)     Stomach bleeding  . Lomotil (Diphenoxylate)     Stomach bleeding  . Ultram (Tramadol)     Stomach bleeding. Patient does not recall taking this medication.    CHIEF COMPLAINT:  Manage left hip osteoarthritis, acute blood loss anemia, and vertigo.    HISTORY OF PRESENT ILLNESS:  The patient is an 79 year-old, Caucasian female.    HIP OSTEOARTHRITIS: patient had advanced end stage OA of the hip with progressively worsening pain & dysfunction.  Pt failed non-surgical conservative management.  Therefore pt underwent total hip arthroplasty & tolerated the procedure well.  Pt denies hip pain currently.  Pt was admitted to this facility for short term rehabilitation.   ANEMIA: Postoperatively, patient suffered acute blood loss.   The anemia has been stable. The patient denies fatigue, melena or hematochezia. The patient is currently not on iron.  Last hemoglobin is 9.8, prior to that 9.6 and 10.4.    VERTIGO:  Patient is complaining of intermittent dizziness with a spinning sensation and nausea, but denies vomiting.    Symptoms are exacerbated with head-turning.  She denies abdominal pain.  She is  currently not on any medications her symptoms.    PAST MEDICAL HISTORY :  Past Medical History  Diagnosis Date  . Contracture of lower leg joint   . Pain in joint, pelvic region and thigh   . Disturbance of skin sensation   . Pain in joint, lower leg   . Restless legs syndrome (RLS)   . Disorder of bone and cartilage, unspecified   . Unspecified cataract     not surgically ready  .  Complication of anesthesia   . PONV (postoperative nausea and vomiting)   . Thyrotoxicosis without mention of goiter or other cause, without mention of thyrotoxic crisis or storm     tx. radioactive Iodine s/p yrs after having thyroid goiter surgery  . Arthritis     Osteoarthritis -knees, hips-"pseudo gout right knee" "curvature of spine"  . Gait instability     due to contractures and curvature to spine uses-walker to ambulate  . Hypoglycemia, unspecified   . Hypoglycemia     PAST SURGICAL HISTORY: Past Surgical History  Procedure Laterality Date  . Tonsillectomy  1932  . Goiter resection  1958  . Cholecystectomy    . Total hip arthroplasty Left 11/28/2012    Procedure: LEFT TOTAL HIP ARTHROPLASTY ANTERIOR APPROACH;  Surgeon: Kathryne Hitch, MD;  Location: [facility];  Service: Orthopedics;  Laterality: Left;    SOCIAL HISTORY:  reports that she has never smoked. She does not have any smokeless tobacco history on file. She reports that she does not drink alcohol or use illicit drugs.  FAMILY HISTORY: None  CURRENT MEDICATIONS: Reviewed per MAR  REVIEW OF SYSTEMS:  See HPI otherwise 14 point ROS is negative.  PHYSICAL EXAMINATION  VS:  T 97       P 107      RR 20  BP 142/81       POX 95%        WT (Lb)  GENERAL: no acute distress, thin body habitus EYES: conjunctivae normal, sclerae normal, normal eye lids MOUTH/THROAT: lips without lesions,no lesions in the mouth,tongue is without lesions,uvula elevates in midline NECK: supple, trachea midline, no neck masses, no thyroid tenderness, no thyromegaly LYMPHATICS: no LAN in the neck, no supraclavicular LAN RESPIRATORY: breathing is even & unlabored, BS CTAB CARDIAC: RRR, no murmur,no extra heart sounds, no edema GI:  ABDOMEN: abdomen soft, normal BS, no masses, no tenderness  LIVER/SPLEEN: no hepatomegaly, no splenomegaly MUSCULOSKELETAL: HEAD: normal to inspection & palpation BACK: spine kyphotic, no scoliosis or  spinal processes tenderness EXTREMITIES: LEFT UPPER EXTREMITY: full range of motion, normal strength & tone RIGHT UPPER EXTREMITY:  full range of motion, normal strength & tone LEFT LOWER EXTREMITY: strength intact, range of motion not tested due to surgery  RIGHT LOWER EXTREMITY: strength intact, range of motion minimal  PSYCHIATRIC: the patient is alert & oriented to person, affect & behavior appropriate  LABS/RADIOLOGY: Hemoglobin 9.8, MCV 92.4, otherwise CBC normal.    BMP normal.  Postoperative left hip x-ray, pelvic x-ray showed left total hip arthroplasty without complications.    ASSESSMENT/PLAN:  Left hip osteoarthritis.  Status post left hip arthroplasty.  Continue rehabilitation.  Acute blood loss anemia.  Hemoglobin is stable.    Vertigo.  Recurrent problem.   Start meclizine 25 mg q.6 p.r.n.    Elevated blood pressure.  We will monitor.  She has no history of hypertension.    Osteoarthritis.  Continue pain medications as needed.   I have reviewed patient's medical records received at admission/from hospitalization.  CPT CODE: [address]

## 2012-12-30 ENCOUNTER — Non-Acute Institutional Stay (SKILLED_NURSING_FACILITY): Payer: Medicare Other | Admitting: Adult Health

## 2012-12-30 DIAGNOSIS — Z7901 Long term (current) use of anticoagulants: Secondary | ICD-10-CM

## 2012-12-30 DIAGNOSIS — M169 Osteoarthritis of hip, unspecified: Secondary | ICD-10-CM | POA: Diagnosis not present

## 2012-12-30 DIAGNOSIS — H8110 Benign paroxysmal vertigo, unspecified ear: Secondary | ICD-10-CM | POA: Diagnosis not present

## 2012-12-30 DIAGNOSIS — D62 Acute posthemorrhagic anemia: Secondary | ICD-10-CM | POA: Diagnosis not present

## 2013-01-01 ENCOUNTER — Non-Acute Institutional Stay (SKILLED_NURSING_FACILITY): Payer: Medicare Other | Admitting: Internal Medicine

## 2013-01-01 DIAGNOSIS — M159 Polyosteoarthritis, unspecified: Secondary | ICD-10-CM | POA: Insufficient documentation

## 2013-01-01 DIAGNOSIS — H8110 Benign paroxysmal vertigo, unspecified ear: Secondary | ICD-10-CM | POA: Diagnosis not present

## 2013-01-01 DIAGNOSIS — M1990 Unspecified osteoarthritis, unspecified site: Secondary | ICD-10-CM | POA: Diagnosis not present

## 2013-01-01 DIAGNOSIS — M169 Osteoarthritis of hip, unspecified: Secondary | ICD-10-CM | POA: Diagnosis not present

## 2013-01-01 DIAGNOSIS — D62 Acute posthemorrhagic anemia: Secondary | ICD-10-CM

## 2013-01-01 NOTE — Progress Notes (Signed)
PROGRESS NOTE  DATE: 01/01/2013  FACILITY: Nursing Home Location: [facility]  LEVEL OF CARE: SNF (31)  Routine Visit  CHIEF COMPLAINT:  Manage left hip osteoarthritis, acute blood loss anemia and osteoarthritis  HISTORY OF PRESENT ILLNESS:  REASSESSMENT OF ONGOING PROBLEM(S):  ANEMIA: The anemia has been stable. The patient denies fatigue, melena or hematochezia. No complications from the medications currently being used. In 5/14 hemoglobin 9.8, MCV 92.4.  HIP OSTEOARTHRITIS: patient had advanced end stage OA of the hip with progressively worsening pain & dysfunction.  Pt failed non-surgical conservative management.  Therefore pt underwent total hip arthroplasty & tolerated the procedure well.  Pt denies hip pain currently.  Pt was admitted to this facility for short term rehabilitation.  ARTHRITIS: Patient's arthritis remains stable.  The patient denies ongoing joint pains, stiffness, swelling, warmth & redness.  No complications reported from the medication(s) currently being used.  PAST MEDICAL HISTORY : Reviewed.  No changes.  CURRENT MEDICATIONS: Reviewed per [facility]  REVIEW OF SYSTEMS:  GENERAL: no change in appetite, no fatigue, no weight changes, no fever, chills or weakness RESPIRATORY: no cough, SOB, DOE, wheezing, hemoptysis CARDIAC: no chest pain, edema or palpitations GI: no abdominal pain, diarrhea, constipation, heart burn, nausea or vomiting  PHYSICAL EXAMINATION  VS:  T 97       P 107      RR 20      BP 142/81     POX % 95    WT (Lb)  GENERAL: no acute distress, thin body habitus EYES: conjunctivae normal, sclerae normal, normal eye lids NECK: supple, trachea midline, no neck masses, no thyroid tenderness, no thyromegaly LYMPHATICS: no LAN in the neck, no supraclavicular LAN RESPIRATORY: breathing is even & unlabored, BS CTAB CARDIAC: RRR, no murmur,no extra heart sounds, no edema GI: abdomen soft, normal BS, no masses, no tenderness,  no hepatomegaly, no splenomegaly PSYCHIATRIC: the patient is alert & oriented to person, affect & behavior appropriate  LABS/RADIOLOGY:  5/14 hemoglobin 9.8, MCV 92.4 otherwise CBC normal, BMP normal  ASSESSMENT/PLAN:  Left hip osteoarthritis-status post left total hip arthroplasty. Continue rehabilitation. Acute bloodloss anemia-stable. Osteoarthritis-pain well-controlled. Vertigo-continue meclizine when necessary. Check liver profile.  CPT CODE: 16109

## 2013-01-06 ENCOUNTER — Non-Acute Institutional Stay (SKILLED_NURSING_FACILITY): Payer: Medicare Other | Admitting: Adult Health

## 2013-01-06 DIAGNOSIS — M169 Osteoarthritis of hip, unspecified: Secondary | ICD-10-CM

## 2013-01-06 DIAGNOSIS — M899 Disorder of bone, unspecified: Secondary | ICD-10-CM | POA: Diagnosis not present

## 2013-01-06 DIAGNOSIS — R293 Abnormal posture: Secondary | ICD-10-CM | POA: Diagnosis not present

## 2013-01-06 DIAGNOSIS — M24569 Contracture, unspecified knee: Secondary | ICD-10-CM | POA: Diagnosis not present

## 2013-01-06 DIAGNOSIS — Z7901 Long term (current) use of anticoagulants: Secondary | ICD-10-CM | POA: Diagnosis not present

## 2013-01-06 DIAGNOSIS — M1990 Unspecified osteoarthritis, unspecified site: Secondary | ICD-10-CM | POA: Diagnosis not present

## 2013-01-06 DIAGNOSIS — R269 Unspecified abnormalities of gait and mobility: Secondary | ICD-10-CM | POA: Diagnosis not present

## 2013-01-06 DIAGNOSIS — E162 Hypoglycemia, unspecified: Secondary | ICD-10-CM | POA: Diagnosis not present

## 2013-01-06 DIAGNOSIS — D62 Acute posthemorrhagic anemia: Secondary | ICD-10-CM | POA: Diagnosis not present

## 2013-01-06 DIAGNOSIS — H8110 Benign paroxysmal vertigo, unspecified ear: Secondary | ICD-10-CM | POA: Diagnosis not present

## 2013-01-06 DIAGNOSIS — Z471 Aftercare following joint replacement surgery: Secondary | ICD-10-CM | POA: Diagnosis not present

## 2013-01-06 DIAGNOSIS — M25559 Pain in unspecified hip: Secondary | ICD-10-CM | POA: Diagnosis not present

## 2013-01-06 DIAGNOSIS — G2581 Restless legs syndrome: Secondary | ICD-10-CM | POA: Diagnosis not present

## 2013-01-06 DIAGNOSIS — Z96649 Presence of unspecified artificial hip joint: Secondary | ICD-10-CM | POA: Diagnosis not present

## 2013-01-06 DIAGNOSIS — M6281 Muscle weakness (generalized): Secondary | ICD-10-CM | POA: Diagnosis not present

## 2013-01-08 ENCOUNTER — Non-Acute Institutional Stay (SKILLED_NURSING_FACILITY): Payer: Medicare Other | Admitting: Adult Health

## 2013-01-08 DIAGNOSIS — Z7901 Long term (current) use of anticoagulants: Secondary | ICD-10-CM | POA: Diagnosis not present

## 2013-01-08 DIAGNOSIS — H8110 Benign paroxysmal vertigo, unspecified ear: Secondary | ICD-10-CM | POA: Diagnosis not present

## 2013-01-08 DIAGNOSIS — M169 Osteoarthritis of hip, unspecified: Secondary | ICD-10-CM

## 2013-01-08 DIAGNOSIS — D62 Acute posthemorrhagic anemia: Secondary | ICD-10-CM | POA: Diagnosis not present

## 2013-01-14 ENCOUNTER — Non-Acute Institutional Stay (SKILLED_NURSING_FACILITY): Payer: Medicare Other | Admitting: Adult Health

## 2013-01-14 DIAGNOSIS — D62 Acute posthemorrhagic anemia: Secondary | ICD-10-CM

## 2013-01-14 DIAGNOSIS — M169 Osteoarthritis of hip, unspecified: Secondary | ICD-10-CM

## 2013-01-14 DIAGNOSIS — Z7901 Long term (current) use of anticoagulants: Secondary | ICD-10-CM | POA: Diagnosis not present

## 2013-01-14 DIAGNOSIS — H8110 Benign paroxysmal vertigo, unspecified ear: Secondary | ICD-10-CM | POA: Diagnosis not present

## 2013-01-18 DIAGNOSIS — G2581 Restless legs syndrome: Secondary | ICD-10-CM | POA: Diagnosis not present

## 2013-01-18 DIAGNOSIS — Z471 Aftercare following joint replacement surgery: Secondary | ICD-10-CM | POA: Diagnosis not present

## 2013-01-18 DIAGNOSIS — M169 Osteoarthritis of hip, unspecified: Secondary | ICD-10-CM | POA: Diagnosis not present

## 2013-01-18 DIAGNOSIS — Z96649 Presence of unspecified artificial hip joint: Secondary | ICD-10-CM | POA: Diagnosis not present

## 2013-01-18 DIAGNOSIS — Z5189 Encounter for other specified aftercare: Secondary | ICD-10-CM | POA: Diagnosis not present

## 2013-01-18 DIAGNOSIS — H8110 Benign paroxysmal vertigo, unspecified ear: Secondary | ICD-10-CM | POA: Diagnosis not present

## 2013-01-20 DIAGNOSIS — Z471 Aftercare following joint replacement surgery: Secondary | ICD-10-CM | POA: Diagnosis not present

## 2013-01-20 DIAGNOSIS — G2581 Restless legs syndrome: Secondary | ICD-10-CM | POA: Diagnosis not present

## 2013-01-20 DIAGNOSIS — Z5189 Encounter for other specified aftercare: Secondary | ICD-10-CM | POA: Diagnosis not present

## 2013-01-20 DIAGNOSIS — H8110 Benign paroxysmal vertigo, unspecified ear: Secondary | ICD-10-CM | POA: Diagnosis not present

## 2013-01-20 DIAGNOSIS — Z96649 Presence of unspecified artificial hip joint: Secondary | ICD-10-CM | POA: Diagnosis not present

## 2013-01-21 DIAGNOSIS — Z471 Aftercare following joint replacement surgery: Secondary | ICD-10-CM | POA: Diagnosis not present

## 2013-01-21 DIAGNOSIS — Z96649 Presence of unspecified artificial hip joint: Secondary | ICD-10-CM | POA: Diagnosis not present

## 2013-01-21 DIAGNOSIS — G2581 Restless legs syndrome: Secondary | ICD-10-CM | POA: Diagnosis not present

## 2013-01-21 DIAGNOSIS — Z5189 Encounter for other specified aftercare: Secondary | ICD-10-CM | POA: Diagnosis not present

## 2013-01-21 DIAGNOSIS — H8110 Benign paroxysmal vertigo, unspecified ear: Secondary | ICD-10-CM | POA: Diagnosis not present

## 2013-01-22 DIAGNOSIS — Z5189 Encounter for other specified aftercare: Secondary | ICD-10-CM | POA: Diagnosis not present

## 2013-01-22 DIAGNOSIS — G2581 Restless legs syndrome: Secondary | ICD-10-CM | POA: Diagnosis not present

## 2013-01-22 DIAGNOSIS — Z96649 Presence of unspecified artificial hip joint: Secondary | ICD-10-CM | POA: Diagnosis not present

## 2013-01-22 DIAGNOSIS — H8110 Benign paroxysmal vertigo, unspecified ear: Secondary | ICD-10-CM | POA: Diagnosis not present

## 2013-01-22 DIAGNOSIS — Z471 Aftercare following joint replacement surgery: Secondary | ICD-10-CM | POA: Diagnosis not present

## 2013-01-26 DIAGNOSIS — H8110 Benign paroxysmal vertigo, unspecified ear: Secondary | ICD-10-CM | POA: Diagnosis not present

## 2013-01-26 DIAGNOSIS — Z471 Aftercare following joint replacement surgery: Secondary | ICD-10-CM | POA: Diagnosis not present

## 2013-01-26 DIAGNOSIS — Z5189 Encounter for other specified aftercare: Secondary | ICD-10-CM | POA: Diagnosis not present

## 2013-01-26 DIAGNOSIS — Z96649 Presence of unspecified artificial hip joint: Secondary | ICD-10-CM | POA: Diagnosis not present

## 2013-01-26 DIAGNOSIS — G2581 Restless legs syndrome: Secondary | ICD-10-CM | POA: Diagnosis not present

## 2013-01-27 DIAGNOSIS — H8110 Benign paroxysmal vertigo, unspecified ear: Secondary | ICD-10-CM | POA: Diagnosis not present

## 2013-01-27 DIAGNOSIS — Z5189 Encounter for other specified aftercare: Secondary | ICD-10-CM | POA: Diagnosis not present

## 2013-01-27 DIAGNOSIS — Z96649 Presence of unspecified artificial hip joint: Secondary | ICD-10-CM | POA: Diagnosis not present

## 2013-01-27 DIAGNOSIS — G2581 Restless legs syndrome: Secondary | ICD-10-CM | POA: Diagnosis not present

## 2013-01-27 DIAGNOSIS — Z471 Aftercare following joint replacement surgery: Secondary | ICD-10-CM | POA: Diagnosis not present

## 2013-01-28 DIAGNOSIS — Z471 Aftercare following joint replacement surgery: Secondary | ICD-10-CM | POA: Diagnosis not present

## 2013-01-28 DIAGNOSIS — G2581 Restless legs syndrome: Secondary | ICD-10-CM | POA: Diagnosis not present

## 2013-01-28 DIAGNOSIS — Z5189 Encounter for other specified aftercare: Secondary | ICD-10-CM | POA: Diagnosis not present

## 2013-01-28 DIAGNOSIS — H8110 Benign paroxysmal vertigo, unspecified ear: Secondary | ICD-10-CM | POA: Diagnosis not present

## 2013-01-28 DIAGNOSIS — Z96649 Presence of unspecified artificial hip joint: Secondary | ICD-10-CM | POA: Diagnosis not present

## 2013-01-29 DIAGNOSIS — Z96649 Presence of unspecified artificial hip joint: Secondary | ICD-10-CM | POA: Diagnosis not present

## 2013-01-29 DIAGNOSIS — Z5189 Encounter for other specified aftercare: Secondary | ICD-10-CM | POA: Diagnosis not present

## 2013-01-29 DIAGNOSIS — Z471 Aftercare following joint replacement surgery: Secondary | ICD-10-CM | POA: Diagnosis not present

## 2013-01-29 DIAGNOSIS — H8110 Benign paroxysmal vertigo, unspecified ear: Secondary | ICD-10-CM | POA: Diagnosis not present

## 2013-01-29 DIAGNOSIS — G2581 Restless legs syndrome: Secondary | ICD-10-CM | POA: Diagnosis not present

## 2013-02-03 DIAGNOSIS — Z471 Aftercare following joint replacement surgery: Secondary | ICD-10-CM | POA: Diagnosis not present

## 2013-02-03 DIAGNOSIS — Z96649 Presence of unspecified artificial hip joint: Secondary | ICD-10-CM | POA: Diagnosis not present

## 2013-02-03 DIAGNOSIS — H8110 Benign paroxysmal vertigo, unspecified ear: Secondary | ICD-10-CM | POA: Diagnosis not present

## 2013-02-03 DIAGNOSIS — Z5189 Encounter for other specified aftercare: Secondary | ICD-10-CM | POA: Diagnosis not present

## 2013-02-03 DIAGNOSIS — G2581 Restless legs syndrome: Secondary | ICD-10-CM | POA: Diagnosis not present

## 2013-02-04 DIAGNOSIS — H8110 Benign paroxysmal vertigo, unspecified ear: Secondary | ICD-10-CM | POA: Diagnosis not present

## 2013-02-04 DIAGNOSIS — Z471 Aftercare following joint replacement surgery: Secondary | ICD-10-CM | POA: Diagnosis not present

## 2013-02-04 DIAGNOSIS — Z96649 Presence of unspecified artificial hip joint: Secondary | ICD-10-CM | POA: Diagnosis not present

## 2013-02-04 DIAGNOSIS — G2581 Restless legs syndrome: Secondary | ICD-10-CM | POA: Diagnosis not present

## 2013-02-04 DIAGNOSIS — Z5189 Encounter for other specified aftercare: Secondary | ICD-10-CM | POA: Diagnosis not present

## 2013-02-05 DIAGNOSIS — Z5189 Encounter for other specified aftercare: Secondary | ICD-10-CM | POA: Diagnosis not present

## 2013-02-05 DIAGNOSIS — G2581 Restless legs syndrome: Secondary | ICD-10-CM | POA: Diagnosis not present

## 2013-02-05 DIAGNOSIS — Z471 Aftercare following joint replacement surgery: Secondary | ICD-10-CM | POA: Diagnosis not present

## 2013-02-05 DIAGNOSIS — Z96649 Presence of unspecified artificial hip joint: Secondary | ICD-10-CM | POA: Diagnosis not present

## 2013-02-05 DIAGNOSIS — H8110 Benign paroxysmal vertigo, unspecified ear: Secondary | ICD-10-CM | POA: Diagnosis not present

## 2013-02-06 DIAGNOSIS — Z471 Aftercare following joint replacement surgery: Secondary | ICD-10-CM | POA: Diagnosis not present

## 2013-02-06 DIAGNOSIS — H8110 Benign paroxysmal vertigo, unspecified ear: Secondary | ICD-10-CM | POA: Diagnosis not present

## 2013-02-06 DIAGNOSIS — G2581 Restless legs syndrome: Secondary | ICD-10-CM | POA: Diagnosis not present

## 2013-02-06 DIAGNOSIS — Z96649 Presence of unspecified artificial hip joint: Secondary | ICD-10-CM | POA: Diagnosis not present

## 2013-02-06 DIAGNOSIS — Z5189 Encounter for other specified aftercare: Secondary | ICD-10-CM | POA: Diagnosis not present

## 2013-02-10 DIAGNOSIS — G2581 Restless legs syndrome: Secondary | ICD-10-CM | POA: Diagnosis not present

## 2013-02-10 DIAGNOSIS — Z96649 Presence of unspecified artificial hip joint: Secondary | ICD-10-CM | POA: Diagnosis not present

## 2013-02-10 DIAGNOSIS — Z471 Aftercare following joint replacement surgery: Secondary | ICD-10-CM | POA: Diagnosis not present

## 2013-02-10 DIAGNOSIS — Z5189 Encounter for other specified aftercare: Secondary | ICD-10-CM | POA: Diagnosis not present

## 2013-02-10 DIAGNOSIS — H8110 Benign paroxysmal vertigo, unspecified ear: Secondary | ICD-10-CM | POA: Diagnosis not present

## 2013-02-12 DIAGNOSIS — Z5189 Encounter for other specified aftercare: Secondary | ICD-10-CM | POA: Diagnosis not present

## 2013-02-12 DIAGNOSIS — G2581 Restless legs syndrome: Secondary | ICD-10-CM | POA: Diagnosis not present

## 2013-02-12 DIAGNOSIS — Z471 Aftercare following joint replacement surgery: Secondary | ICD-10-CM | POA: Diagnosis not present

## 2013-02-12 DIAGNOSIS — H8110 Benign paroxysmal vertigo, unspecified ear: Secondary | ICD-10-CM | POA: Diagnosis not present

## 2013-02-12 DIAGNOSIS — Z96649 Presence of unspecified artificial hip joint: Secondary | ICD-10-CM | POA: Diagnosis not present

## 2013-02-13 ENCOUNTER — Encounter: Payer: Self-pay | Admitting: Adult Health

## 2013-02-13 DIAGNOSIS — Z7901 Long term (current) use of anticoagulants: Secondary | ICD-10-CM | POA: Insufficient documentation

## 2013-02-13 NOTE — Progress Notes (Signed)
Patient ID: Stacey Clay, female   DOB: 09-04-1922, 79 y.o.   MRN: [id number] Subjective:     Indication: DVT prophylaxis Bleeding signs/symptoms: None Thromboembolic signs/symptoms: None  Missed Coumadin doses: None Medication changes: no Dietary changes: no Bacterial/viral infection: no Other concerns: no  The following portions of the patient's history were reviewed and updated as appropriate: allergies, current medications, past family history, past medical history, past social history, past surgical history and problem list.  Review of Systems A comprehensive review of systems was negative.   Objective:    INR Today: 3.0 Current dose: Coumadin 5 mg by mouth daily  Assessment:    Therapeutic INR for goal of 2-3   Plan:    1. New dose: no change   2. Next INR:   12/30/12

## 2013-02-13 NOTE — Progress Notes (Signed)
Patient ID: Stacey Clay, female   DOB: Mar 11, 1923, 79 y.o.   MRN: [id number]        PROGRESS NOTE  DATE: 01/14/2013   FACILITY: [facility]  LEVEL OF CARE: SNF (31)   CHIEF COMPLAINT:  Discharge Visit   HISTORY OF PRESENT ILLNESS: This is an 79 year old female who is for discharge home with home health PT and OT .she has been admitted to [facility] on 12/02/12 from a long hospital with degenerative joint disease status post left total hip arthroplasty. Patient was admitted to this facility for short-term rehabilitation. Patient has completed SNF rehabilitation and therapy has cleared the patient for discharge.  Reassessment of ongoing problem(s):  ANEMIA: The anemia has been stable. The patient denies fatigue, melena or hematochezia. No complications from the medications currently being used.  VERTIGO - no complaints of dizziness no complications from Antivert    PAST MEDICAL HISTORY : Reviewed.  No changes.  CURRENT MEDICATIONS: Reviewed per [facility]  REVIEW OF SYSTEMS:  GENERAL: no change in appetite, no fatigue, no weight changes, no fever, chills or weakness RESPIRATORY: no cough, SOB, DOE, wheezing, hemoptysis CARDIAC: no chest pain, edema or palpitations GI: no abdominal pain, diarrhea, constipation, heart burn, nausea or vomiting  PHYSICAL EXAMINATION  VS:  T 98.2       P 70       RR 20      BP 124/51      POX 96 %       WT 123.4 (Lb)  GENERAL: no acute distress, normal body habitus EYES: conjunctivae normal, sclerae normal, normal eye lids NECK: supple, trachea midline, no neck masses, no thyroid tenderness, no thyromegaly LYMPHATICS: no LAN in the neck, no supraclavicular LAN RESPIRATORY: breathing is even & unlabored, BS CTAB CARDIAC: RRR, no murmur,no extra heart sounds, no edema GI: abdomen soft, normal BS, no masses, no tenderness, no hepatomegaly, no splenomegaly PSYCHIATRIC: the patient is alert & oriented to person, affect & behavior  appropriate  LABS/RADIOLOGY: 01/02/13 liver panel normal exam total protein 5.7 albumin 3.2 12/03/12 WBC 7.2 hemoglobin 9.8 hematocrit 29.0 BMP normal except creatinine 0.34   ASSESSMENT/PLAN:  DJD of hip status post left total hip arthroplasty - for home health PT and OT  Vertigo - stable  Anemia -  stable    I have filled out patient's discharge paperwork and written prescriptions.  Patient will receive home health PT and OT, ST.    Total discharge time: Less than 30 minutes Discharge time involved coordination of the discharge process with Child psychotherapist, nursing staff and therapy department. Medical justification for home health services verified.    CPT CODE: 13086

## 2013-02-13 NOTE — Progress Notes (Signed)
Patient ID: Stacey Clay, female   DOB: 1922/08/18, 79 y.o.   MRN: [id number]  Subjective:     Indication: DVT prophylaxis Bleeding signs/symptoms: None Thromboembolic signs/symptoms: None  Missed Coumadin doses: None Medication changes: no Dietary changes: no Bacterial/viral infection: no Other concerns: no  The following portions of the patient's history were reviewed and updated as appropriate: allergies, current medications, past family history, past medical history, past social history, past surgical history and problem list.  Review of Systems A comprehensive review of systems was negative.   Objective:    INR Today: 1.7   Current dose: Coumadin 4.5 mg by mouth daily  Assessment:   subtherapeutic INR for goal of 2-3   Plan:    1. New dose: increase Coumadin to 5 mg by mouth daily 2. Next INR:   01/08/13

## 2013-02-13 NOTE — Progress Notes (Signed)
Patient ID: Stacey Clay, female   DOB: 21-Apr-1923, 79 y.o.   MRN: [id number]  Subjective:     Indication: DVT prophylaxis Bleeding signs/symptoms: None Thromboembolic signs/symptoms: None  Missed Coumadin doses: None Medication changes: no Dietary changes: no Bacterial/viral infection: no Other concerns: no  The following portions of the patient's history were reviewed and updated as appropriate: allergies, current medications, past family history, past medical history, past social history, past surgical history and problem list.  Review of Systems A comprehensive review of systems was negative.   Objective:    INR Today: 3.1 Current dose: Coumadin 5 mg by mouth daily  Assessment:   slightly supratherapeutic INR for goal of 2-3   Plan:    1. New dose: Decrease Coumadin to 4.5 mg by mouth daily 2. Next INR:   01/02/13

## 2013-02-13 NOTE — Progress Notes (Signed)
Patient ID: Stacey Clay, female   DOB: 1922-12-16, 79 y.o.   MRN: [id number]   Subjective:     Indication: DVT prophylaxis Bleeding signs/symptoms: None Thromboembolic signs/symptoms: None  Missed Coumadin doses: None Medication changes: no Dietary changes: no Bacterial/viral infection: no Other concerns: no  The following portions of the patient's history were reviewed and updated as appropriate: allergies, current medications, past family history, past medical history, past social history, past surgical history and problem list.  Review of Systems A comprehensive review of systems was negative.   Objective:    INR Today: 1.7   Current dose: Coumadin 5 mg by mouth daily  Assessment:   subtherapeutic INR for goal of 2-3   Plan:    1. New dose: increase Coumadin to 5.5 mg by mouth daily 2. Next INR:   01/13/13

## 2013-02-17 DIAGNOSIS — G2581 Restless legs syndrome: Secondary | ICD-10-CM | POA: Diagnosis not present

## 2013-02-17 DIAGNOSIS — H8110 Benign paroxysmal vertigo, unspecified ear: Secondary | ICD-10-CM | POA: Diagnosis not present

## 2013-02-17 DIAGNOSIS — Z5189 Encounter for other specified aftercare: Secondary | ICD-10-CM | POA: Diagnosis not present

## 2013-02-17 DIAGNOSIS — Z96649 Presence of unspecified artificial hip joint: Secondary | ICD-10-CM | POA: Diagnosis not present

## 2013-02-17 DIAGNOSIS — Z471 Aftercare following joint replacement surgery: Secondary | ICD-10-CM | POA: Diagnosis not present

## 2013-02-19 DIAGNOSIS — Z5189 Encounter for other specified aftercare: Secondary | ICD-10-CM | POA: Diagnosis not present

## 2013-02-19 DIAGNOSIS — Z96649 Presence of unspecified artificial hip joint: Secondary | ICD-10-CM | POA: Diagnosis not present

## 2013-02-19 DIAGNOSIS — H8110 Benign paroxysmal vertigo, unspecified ear: Secondary | ICD-10-CM | POA: Diagnosis not present

## 2013-02-19 DIAGNOSIS — Z471 Aftercare following joint replacement surgery: Secondary | ICD-10-CM | POA: Diagnosis not present

## 2013-02-19 DIAGNOSIS — G2581 Restless legs syndrome: Secondary | ICD-10-CM | POA: Diagnosis not present

## 2013-03-30 DIAGNOSIS — Z23 Encounter for immunization: Secondary | ICD-10-CM | POA: Diagnosis not present

## 2013-05-11 ENCOUNTER — Ambulatory Visit (INDEPENDENT_AMBULATORY_CARE_PROVIDER_SITE_OTHER): Payer: Medicare Other | Admitting: Internal Medicine

## 2013-05-11 ENCOUNTER — Encounter: Payer: Self-pay | Admitting: Internal Medicine

## 2013-05-11 VITALS — BP 110/70 | HR 70 | Temp 98.1°F | Resp 16 | Wt 123.0 lb

## 2013-05-11 DIAGNOSIS — H8110 Benign paroxysmal vertigo, unspecified ear: Secondary | ICD-10-CM

## 2013-05-11 DIAGNOSIS — K439 Ventral hernia without obstruction or gangrene: Secondary | ICD-10-CM | POA: Diagnosis not present

## 2013-05-11 DIAGNOSIS — Z96649 Presence of unspecified artificial hip joint: Secondary | ICD-10-CM

## 2013-05-11 DIAGNOSIS — M11261 Other chondrocalcinosis, right knee: Secondary | ICD-10-CM

## 2013-05-11 DIAGNOSIS — Z96642 Presence of left artificial hip joint: Secondary | ICD-10-CM

## 2013-05-11 DIAGNOSIS — M1610 Unilateral primary osteoarthritis, unspecified hip: Secondary | ICD-10-CM | POA: Diagnosis not present

## 2013-05-11 DIAGNOSIS — D62 Acute posthemorrhagic anemia: Secondary | ICD-10-CM

## 2013-05-11 DIAGNOSIS — M11869 Other specified crystal arthropathies, unspecified knee: Secondary | ICD-10-CM

## 2013-05-11 DIAGNOSIS — M169 Osteoarthritis of hip, unspecified: Secondary | ICD-10-CM | POA: Diagnosis not present

## 2013-05-11 NOTE — Progress Notes (Signed)
Patient ID: Stacey Clay, female   DOB: Oct 07, 1922, 79 y.o.   MRN: [id number] Location:  [facility]   Allergies  Allergen Reactions  . Aleve [Naproxen Sodium]     Stomach bleeding  . Aspirin     Stomach bleeding  . Darvocet [Propoxyphene-Acetaminophen]     Stomach bleeding  . Darvon [Propoxyphene Hcl]     Stomach bleeding  . Lomotil [Diphenoxylate]     Stomach bleeding  . Ultram [Tramadol]     Stomach bleeding. Patient does not recall taking this medication.    Chief Complaint  Patient presents with  . Hospitalization Follow-up    surgery f/u hip repacement  . other    question about knot on her RT side abdomen    HPI: Patient is a 79 y.o.  White female  seen in the office today for f/u of chronic medical conditions. Is happy that had left total hip replacement.  Right hip and knee are now giving her problems, but she is not ready for another surgery.   Right side of abdomen distended.  Has had this for years.  Never has hurt her.  Wants to know if it is a hernia.   Feels like it has been there for 2 years or more.   Doesn't notice any change in the area.   Is not getting around faster b/c of the right side. Left hip is working well now.   Right knee pseudogout bothers her more even then the right hip.  Is worst as gets up from chair.    Sleeps well with new mattress at home.    Review of Systems:  Review of Systems  Constitutional: Negative for fever, chills, weight loss and malaise/fatigue.  HENT: Negative for congestion.   Eyes: Negative for blurred vision.  Respiratory: Negative for shortness of breath.   Cardiovascular: Negative for chest pain.  Gastrointestinal: Negative for abdominal pain, constipation, blood in stool and melena.       RLQ hernia  Genitourinary: Negative for dysuria, urgency and frequency.  Musculoskeletal: Positive for joint pain. Negative for falls and myalgias.  Neurological: Negative for dizziness  and loss of consciousness.  Endo/Heme/Allergies: Bruises/bleeds easily.  Psychiatric/Behavioral: Negative for depression and memory loss.    Past Medical History  Diagnosis Date  . Contracture of lower leg joint   . Pain in joint, pelvic region and thigh   . Disturbance of skin sensation   . Pain in joint, lower leg   . Restless legs syndrome (RLS)   . Disorder of bone and cartilage, unspecified   . Unspecified cataract     not surgically ready  . Complication of anesthesia   . PONV (postoperative nausea and vomiting)   . Thyrotoxicosis without mention of goiter or other cause, without mention of thyrotoxic crisis or storm     tx. radioactive Iodine s/p yrs after having thyroid goiter surgery  . Arthritis     Osteoarthritis -knees, hips-"pseudo gout right knee" "curvature of spine"  . Gait instability     due to contractures and curvature to spine uses-walker to ambulate  . Hypoglycemia, unspecified   . Hypoglycemia     Past Surgical History  Procedure Laterality Date  . Tonsillectomy  1932  . Goiter resection  1958  . Cholecystectomy    . Total hip arthroplasty Left 11/28/2012    Procedure: LEFT TOTAL HIP ARTHROPLASTY ANTERIOR APPROACH;  Surgeon: Kathryne Hitch, MD;  Location: [facility];  Service: Orthopedics;  Laterality: Left;   Social History:   reports that she has never smoked. She does not have any smokeless tobacco history on file. She reports that she does not drink alcohol or use illicit drugs.  History reviewed. No pertinent family history.  Medications: Patient's Medications  New Prescriptions   No medications on file  Previous Medications   ACETAMINOPHEN (TYLENOL) 650 MG CR TABLET    Take 650 mg by mouth 2 (two) times daily. Take 2 tablets (1000 mg) in the am & 650 mg in the evening for pain   MULTIPLE VITAMIN (MULTIVITAMIN) TABLET    Take 1 tablet by mouth daily.  Modified Medications   No medications on file  Discontinued Medications    HYDROCODONE-ACETAMINOPHEN (NORCO/VICODIN) 5-325 MG PER TABLET    Take 1-2 tablets by mouth every 4 (four) hours as needed.   WARFARIN (COUMADIN) 5 MG TABLET    Take 1 tablet (5 mg total) by mouth one time only at 6 PM.     Physical Exam: Filed Vitals:   05/11/13 1600  BP: 110/70  Pulse: 70  Temp: 98.1 F (36.7 C)  TempSrc: Oral  Resp: 16  Weight: 123 lb (55.792 kg)  SpO2: 94%  Physical Exam  Constitutional: She is oriented to person, place, and time. She appears well-developed and well-nourished. No distress.  HENT:  Head: Normocephalic and atraumatic.  Cardiovascular: Normal rate, regular rhythm, normal heart sounds and intact distal pulses.   Pulmonary/Chest: Effort normal and breath sounds normal. No respiratory distress.  Abdominal: Soft. Bowel sounds are normal.  RLQ hernia present, nontender  Musculoskeletal:  Scoliosis, no localized tenderness of right hip or knee, small amt of crepitus in right knee  Neurological: She is alert and oriented to person, place, and time.  Skin: Skin is warm and dry. There is pallor.  Psychiatric: She has a normal mood and affect.    Labs reviewed: Basic Metabolic Panel:  Recent Labs  29/52/84 1712 11/20/12 1530 11/29/12 0435  NA 140 138 136  K 4.6 4.7 3.7  CL 105 102 100  CO2 23 29 27   GLUCOSE 80 76 141*  BUN 24 22 11   CREATININE 0.49* 0.45* 0.42*  CALCIUM 9.7 9.9 8.6  CBC:  Recent Labs  10/23/12 1712  11/29/12 0435 11/30/12 0539 12/01/12 0410  WBC 4.1  < > 7.6 9.3 9.7  NEUTROABS 2.1  --   --   --   --   HGB 12.9  < > 10.6* 10.4* 9.6*  HCT 38.0  < > 33.2* 32.0* 29.4*  MCV 96  < > 97.6 98.5 97.4  PLT  --   < > 212 210 210  < > = values in this interval not displayed.  Past Procedures: 11/28/12:  Underwent left total hip arthroplasty by Dr. Rayburn Ma 11/28/12:  LEFT HIP - COMPLETE 2+ VIEW Left total hip arthroplasty without immediate complication  11/28/12:  Portable pelvis xrays:  Status post left total hip replacement  without evidence for immediate hardware complications.  Assessment/Plan 1. DJD (degenerative joint disease) of hip -right hip remains painful at times due to arthritis -uses tylenol if needed  2. Pseudogout of right knee -previously diagnosed -bothers her on standing  3. Status post left hip replacement -doing well -ambulates with rollator walker -is happy she had the surgery   4. Hernia of abdominal wall -right lower quadrant  -present several years -no associated pain  5. Benign paroxysmal positional vertigo -had only one episode since returning  home -gets up slowly when she awakens in the morning  6. Postoperative anemia due to acute blood loss -f/u cbc today  Labs/tests ordered:  Cbc, bmp Next appt:  3 mos

## 2013-05-12 LAB — CBC WITH DIFFERENTIAL/PLATELET
Basophils Absolute: 0 10*3/uL (ref 0.0–0.2)
Basos: 0 %
Eos: 7 %
Eosinophils Absolute: 0.3 10*3/uL (ref 0.0–0.4)
HCT: 37.3 % (ref 34.0–46.6)
Hemoglobin: 12.4 g/dL (ref 11.1–15.9)
Immature Grans (Abs): 0 10*3/uL (ref 0.0–0.1)
Immature Granulocytes: 0 %
Lymphocytes Absolute: 1.5 10*3/uL (ref 0.7–3.1)
Lymphs: 32 %
MCH: 31.6 pg (ref 26.6–33.0)
MCHC: 33.2 g/dL (ref 31.5–35.7)
MCV: 95 fL (ref 79–97)
Monocytes Absolute: 0.6 10*3/uL (ref 0.1–0.9)
Monocytes: 13 %
Neutrophils Absolute: 2.2 10*3/uL (ref 1.4–7.0)
Neutrophils Relative %: 48 %
RBC: 3.92 x10E6/uL (ref 3.77–5.28)
RDW: 13.9 % (ref 12.3–15.4)
WBC: 4.6 10*3/uL (ref 3.4–10.8)

## 2013-05-12 LAB — COMPREHENSIVE METABOLIC PANEL
ALT: 10 IU/L (ref 0–32)
AST: 16 IU/L (ref 0–40)
Albumin/Globulin Ratio: 1.7 (ref 1.1–2.5)
Albumin: 4.1 g/dL (ref 3.5–4.7)
Alkaline Phosphatase: 70 IU/L (ref 39–117)
BUN/Creatinine Ratio: 37 — ABNORMAL HIGH (ref 11–26)
BUN: 23 mg/dL (ref 8–27)
CO2: 25 mmol/L (ref 18–29)
Calcium: 9.7 mg/dL (ref 8.6–10.2)
Chloride: 103 mmol/L (ref 97–108)
Creatinine, Ser: 0.63 mg/dL (ref 0.57–1.00)
GFR calc Af Amer: 93 mL/min/{1.73_m2} (ref >59)
GFR calc non Af Amer: 80 mL/min/{1.73_m2} (ref >59)
Globulin, Total: 2.4 g/dL (ref 1.5–4.5)
Glucose: 110 mg/dL — ABNORMAL HIGH (ref 65–99)
Potassium: 4.6 mmol/L (ref 3.5–5.2)
Sodium: 141 mmol/L (ref 134–144)
Total Bilirubin: 0.4 mg/dL (ref 0.0–1.2)
Total Protein: 6.5 g/dL (ref 6.0–8.5)

## 2013-06-02 ENCOUNTER — Encounter: Payer: Self-pay | Admitting: Internal Medicine

## 2013-06-15 DIAGNOSIS — H6120 Impacted cerumen, unspecified ear: Secondary | ICD-10-CM | POA: Diagnosis not present

## 2013-06-15 DIAGNOSIS — H905 Unspecified sensorineural hearing loss: Secondary | ICD-10-CM | POA: Diagnosis not present

## 2013-08-06 DIAGNOSIS — L820 Inflamed seborrheic keratosis: Secondary | ICD-10-CM | POA: Diagnosis not present

## 2013-08-06 DIAGNOSIS — L821 Other seborrheic keratosis: Secondary | ICD-10-CM | POA: Diagnosis not present

## 2013-08-06 DIAGNOSIS — L259 Unspecified contact dermatitis, unspecified cause: Secondary | ICD-10-CM | POA: Diagnosis not present

## 2013-08-14 ENCOUNTER — Ambulatory Visit: Payer: Self-pay | Admitting: Internal Medicine

## 2013-08-24 ENCOUNTER — Ambulatory Visit: Payer: Self-pay | Admitting: Internal Medicine

## 2013-10-01 ENCOUNTER — Ambulatory Visit (INDEPENDENT_AMBULATORY_CARE_PROVIDER_SITE_OTHER): Payer: Medicare Other | Admitting: Internal Medicine

## 2013-10-01 ENCOUNTER — Encounter: Payer: Self-pay | Admitting: Internal Medicine

## 2013-10-01 VITALS — BP 112/58 | HR 90 | Temp 97.0°F | Resp 16 | Wt 120.0 lb

## 2013-10-01 DIAGNOSIS — B354 Tinea corporis: Secondary | ICD-10-CM

## 2013-10-01 DIAGNOSIS — M1611 Unilateral primary osteoarthritis, right hip: Secondary | ICD-10-CM

## 2013-10-01 DIAGNOSIS — H8110 Benign paroxysmal vertigo, unspecified ear: Secondary | ICD-10-CM

## 2013-10-01 DIAGNOSIS — M1610 Unilateral primary osteoarthritis, unspecified hip: Secondary | ICD-10-CM

## 2013-10-01 DIAGNOSIS — M11869 Other specified crystal arthropathies, unspecified knee: Secondary | ICD-10-CM | POA: Diagnosis not present

## 2013-10-01 DIAGNOSIS — M11261 Other chondrocalcinosis, right knee: Secondary | ICD-10-CM | POA: Insufficient documentation

## 2013-10-01 MED ORDER — DICLOFENAC SODIUM 1 % TD GEL: 4.0000 g | Freq: Four times a day (QID) | TRANSDERMAL | Status: DC

## 2013-10-01 NOTE — Patient Instructions (Signed)
Please come fasting for your next appt in 4 mos.

## 2013-10-01 NOTE — Progress Notes (Signed)
Patient ID: Stacey Clay, female   DOB: April 28, 1923, 80 y.o.   MRN: [id number]   Location:  [facility]  Code Status: DNR  Allergies  Allergen Reactions  . Aleve [Naproxen Sodium]     Stomach bleeding  . Aspirin     Stomach bleeding  . Darvocet [Propoxyphene N-Acetaminophen]     Stomach bleeding  . Darvon [Propoxyphene Hcl]     Stomach bleeding  . Lomotil [Diphenoxylate]     Stomach bleeding  . Ultram [Tramadol]     Stomach bleeding. Patient does not recall taking this medication.    Chief Complaint  Patient presents with  . Follow-up    4 month f/u with no recent labs (last OV 05/11/13)  . other    RT hip/knee bother her more lately    HPI: Patient is a 80 y.o. white female seen in the office today for medical mgt of chronic diseases.  Walks around house holding onto walker.  Right knee with pseudogout and OA of right hip more bothersome.  Has pain in hip if sits on a hard seat and in bed.  Has pillowtop mattress.  Sleeps on right side.  Still sleeps pretty well, but does wake up a couple of times.  When gets up from a chair, her right knee hurts her.  Knee and hip are both ok when walking.  Only using tylenol when she goes to bed (2 650mg  pills) and ben gay on knee and right hip.  Thinks it helps somewhat.    No other concerns.  I note that she has lost three lbs.  Has red spot on left thigh.  Has put desitin, then curel on it.  Developed a blister.  Went to dermatology in January and had triamcinolone cream bid--used for 2 mos and is now a faint pink spot.  Still not normal skin color.  Has a little left.    Review of Systems:  Review of Systems  Constitutional: Negative for fever and chills.  HENT: Positive for hearing loss.   Eyes: Negative for blurred vision.       Glasses  Respiratory: Negative for shortness of breath.   Cardiovascular: Negative for chest pain.  Gastrointestinal: Negative for abdominal pain, constipation,  blood in stool and melena.  Genitourinary: Negative for dysuria.  Musculoskeletal: Positive for joint pain. Negative for falls.  Neurological: Negative for dizziness.  Psychiatric/Behavioral: Negative for depression and memory loss.     Past Medical History  Diagnosis Date  . Contracture of lower leg joint   . Pain in joint, pelvic region and thigh   . Disturbance of skin sensation   . Pain in joint, lower leg   . Restless legs syndrome (RLS)   . Disorder of bone and cartilage, unspecified   . Unspecified cataract     not surgically ready  . Complication of anesthesia   . PONV (postoperative nausea and vomiting)   . Thyrotoxicosis without mention of goiter or other cause, without mention of thyrotoxic crisis or storm     tx. radioactive Iodine s/p yrs after having thyroid goiter surgery  . Arthritis     Osteoarthritis -knees, hips-"pseudo gout right knee" "curvature of spine"  . Gait instability     due to contractures and curvature to spine uses-walker to ambulate  . Hypoglycemia, unspecified   . Hypoglycemia     Past Surgical History  Procedure Laterality Date  . Tonsillectomy  1932  . Goiter resection  1958  . Cholecystectomy    . Total hip arthroplasty Left 11/28/2012    Procedure: LEFT TOTAL HIP ARTHROPLASTY ANTERIOR APPROACH;  Surgeon: Mcarthur Rossetti, MD;  Location: [facility];  Service: Orthopedics;  Laterality: Left;    Social History:   reports that she has never smoked. She does not have any smokeless tobacco history on file. She reports that she does not drink alcohol or use illicit drugs.  History reviewed. No pertinent family history.  Medications: Patient's Medications  New Prescriptions   No medications on file  Previous Medications   ACETAMINOPHEN (TYLENOL) 650 MG CR TABLET    Take 650 mg by mouth 2 (two) times daily. Take  650 mg in the evening for pain   MULTIPLE VITAMIN (MULTIVITAMIN) TABLET    Take 1 tablet by mouth daily.   TRIAMCINOLONE  CREAM (KENALOG) 0.1 %    Apply 1 application topically 2 (two) times daily.  Modified Medications   No medications on file  Discontinued Medications   No medications on file     Physical Exam: Filed Vitals:   10/01/13 1137  BP: 112/58  Pulse: 90  Temp: 97 F (36.1 C)  TempSrc: Oral  Resp: 16  Weight: 120 lb (54.432 kg)  SpO2: 95%  Physical Exam  Constitutional: She is oriented to person, place, and time. She appears well-developed and well-nourished. No distress.  Cardiovascular: Normal rate, regular rhythm, normal heart sounds and intact distal pulses.   Pulmonary/Chest: Effort normal and breath sounds normal. No respiratory distress.  Abdominal: Soft. Bowel sounds are normal. She exhibits no distension and no mass. There is no tenderness.  Musculoskeletal: She exhibits tenderness.  Gait not steady, uses rollator walker to get around, has short leg; tender right knee  Neurological: She is alert and oriented to person, place, and time.  Skin: There is pallor.  Skin place see hpi     Labs reviewed: Basic Metabolic Panel:  Recent Labs  11/20/12 1530 11/29/12 0435 05/11/13 1707  NA 138 136 141  K 4.7 3.7 4.6  CL 102 100 103  CO2 29 27 25   GLUCOSE 76 141* 110*  BUN 22 11 23   CREATININE 0.45* 0.42* 0.63  CALCIUM 9.9 8.6 9.7   Liver Function Tests:  Recent Labs  05/11/13 1707  AST 16  ALT 10  ALKPHOS 70  BILITOT 0.4  PROT 6.5   No results found for this basename: LIPASE, AMYLASE,  in the last 8760 hours No results found for this basename: AMMONIA,  in the last 8760 hours CBC:  Recent Labs  10/23/12 1712  11/29/12 0435 11/30/12 0539 12/01/12 0410 05/11/13 1707  WBC 4.1  < > 7.6 9.3 9.7 4.6  NEUTROABS 2.1  --   --   --   --  2.2  HGB 12.9  < > 10.6* 10.4* 9.6* 12.4  HCT 38.0  < > 33.2* 32.0* 29.4* 37.3  MCV 96  < > 97.6 98.5 97.4 95  PLT  --   < > 212 210 210  --   < > = values in this interval not displayed.   Assessment/Plan 1. Pseudogout of  right knee -cont voltaren gel, tylenol for pain, regular exercise  2. Degenerative arthritis of hip -s/p surgery, improved pain but still unable to get around well  3. Benign paroxysmal positional vertigo -would benefit from therapy  4. Tinea corporis -cont steroid cream otc    Labs/tests ordered:No orders of the defined types were placed in this encounter.  Next appt:  4 mos unless prn

## 2014-01-21 ENCOUNTER — Encounter: Payer: Self-pay | Admitting: Internal Medicine

## 2014-01-21 ENCOUNTER — Ambulatory Visit (INDEPENDENT_AMBULATORY_CARE_PROVIDER_SITE_OTHER): Payer: Medicare Other | Admitting: Internal Medicine

## 2014-01-21 VITALS — BP 116/64 | HR 58 | Temp 97.5°F | Wt 118.6 lb

## 2014-01-21 DIAGNOSIS — I10 Essential (primary) hypertension: Secondary | ICD-10-CM | POA: Diagnosis not present

## 2014-01-21 DIAGNOSIS — R269 Unspecified abnormalities of gait and mobility: Secondary | ICD-10-CM

## 2014-01-21 DIAGNOSIS — M1611 Unilateral primary osteoarthritis, right hip: Secondary | ICD-10-CM

## 2014-01-21 DIAGNOSIS — M11261 Other chondrocalcinosis, right knee: Secondary | ICD-10-CM

## 2014-01-21 DIAGNOSIS — L821 Other seborrheic keratosis: Secondary | ICD-10-CM | POA: Diagnosis not present

## 2014-01-21 DIAGNOSIS — H8110 Benign paroxysmal vertigo, unspecified ear: Secondary | ICD-10-CM | POA: Diagnosis not present

## 2014-01-21 DIAGNOSIS — Z23 Encounter for immunization: Secondary | ICD-10-CM | POA: Diagnosis not present

## 2014-01-21 DIAGNOSIS — R2681 Unsteadiness on feet: Secondary | ICD-10-CM

## 2014-01-21 DIAGNOSIS — M11869 Other specified crystal arthropathies, unspecified knee: Secondary | ICD-10-CM

## 2014-01-21 DIAGNOSIS — E0590 Thyrotoxicosis, unspecified without thyrotoxic crisis or storm: Secondary | ICD-10-CM | POA: Insufficient documentation

## 2014-01-21 DIAGNOSIS — M1610 Unilateral primary osteoarthritis, unspecified hip: Secondary | ICD-10-CM

## 2014-01-21 MED ORDER — DICLOFENAC SODIUM 1 % TD GEL: 4.0000 g | Freq: Four times a day (QID) | TRANSDERMAL | Status: DC

## 2014-01-21 NOTE — Progress Notes (Signed)
Patient ID: Stacey Clay, female   DOB: [birth year], 80 y.o.   MRN: [id number]   Location:  [facility]  Code Status: does have living will, but not yet notarized--given info of where she can do this  Allergies  Allergen Reactions  . Aleve [Naproxen Sodium]     Stomach bleeding  . Aspirin     Stomach bleeding  . Darvocet [Propoxyphene N-Acetaminophen]     Stomach bleeding  . Darvon [Propoxyphene Hcl]     Stomach bleeding  . Lomotil [Diphenoxylate] Nausea And Vomiting  . Ultram [Tramadol] Nausea And Vomiting    Chief Complaint  Patient presents with  . Follow-up    4 month f/u with no labs.  fall/depression screening done.  . Skin Problem    having skin issues on side of face/back of arms/legs that itches    HPI: Patient is a 80 y.o. white female seen in the office today for medical mgt of chronic diseases.    Has new rollator with cushioned seat  Has skin problems that are aggravating.  Has had a lot of "bumpy skin things on my back".  Previously seen by dermatologist long ago and told benign.  Has one on left cheekbone, bone of arms and legs.  Itchy when they start.  Nonpainful, nonitchy after develop.  Bothered by appearance.   Had contact derm and those diminished around neck, but didn't completely go away.  Had been wearing fleece turtleneck a lot then.  Still has just a touch of vertigo in the morning--goes away after counts to 10.    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  Uses it only before bed on knee and right hip.  Then gets good night's sleep also.  Is HOH.  Plans to go to [facility] dept for hearing aides.    Gave up driving and son takes her places except a friend takes her to church on sundays.  Quit driving b/c of delay moving right knee.  Review of Systems:  Review of Systems  Constitutional:       Weight still low  HENT: Positive for hearing loss.        Needs to f/u with Dr. Lucia Gaskins about  this--gets cerumen removed  Eyes: Negative for blurred vision.  Respiratory: Negative for shortness of breath.   Cardiovascular: Negative for chest pain and leg swelling.  Gastrointestinal: Negative for abdominal pain, blood in stool and melena.  Genitourinary: Negative for dysuria, urgency and frequency.  Musculoskeletal: Positive for joint pain. Negative for falls.  Skin:       Multiple "moles"  Neurological: Positive for dizziness. Negative for loss of consciousness.  Endo/Heme/Allergies: Bruises/bleeds easily.  Psychiatric/Behavioral: Negative for depression and memory loss. The patient does not have insomnia.      Past Medical History  Diagnosis Date  . Contracture of lower leg joint   . Pain in joint, pelvic region and thigh   . Disturbance of skin sensation   . Pain in joint, lower leg   . Restless legs syndrome (RLS)   . Disorder of bone and cartilage, unspecified   . Unspecified cataract     not surgically ready  . Complication of anesthesia   . PONV (postoperative nausea and vomiting)   . Thyrotoxicosis without mention of goiter or other cause, without mention of thyrotoxic crisis or storm     tx. radioactive Iodine s/p yrs after having thyroid goiter surgery  . Arthritis  Osteoarthritis -knees, hips-"pseudo gout right knee" "curvature of spine"  . Gait instability     due to contractures and curvature to spine uses-walker to ambulate  . Hypoglycemia, unspecified   . Hypoglycemia     Past Surgical History  Procedure Laterality Date  . Tonsillectomy  1932  . Goiter resection  1958  . Cholecystectomy    . Total hip arthroplasty Left 11/28/2012    Procedure: LEFT TOTAL HIP ARTHROPLASTY ANTERIOR APPROACH;  Surgeon: Mcarthur Rossetti, MD;  Location: [facility];  Service: Orthopedics;  Laterality: Left;    Social History:   reports that she has never smoked. She does not have any smokeless tobacco history on file. She reports that she does not drink alcohol or  use illicit drugs.  History reviewed. No pertinent family history.  Medications: Patient's Medications  New Prescriptions   No medications on file  Previous Medications   ACETAMINOPHEN (TYLENOL) 650 MG CR TABLET    Take 2 tablets  (650 mg) in the evening for pain   DICLOFENAC SODIUM (VOLTAREN) 1 % GEL    Apply 4 g topically 4 (four) times daily. For right knee and hip arthritis   MULTIPLE VITAMIN (MULTIVITAMIN) TABLET    Take 1 tablet by mouth daily.  Modified Medications   No medications on file  Discontinued Medications   TRIAMCINOLONE CREAM (KENALOG) 0.1 %    Apply 1 application topically 2 (two) times daily.     Physical Exam: Filed Vitals:   01/21/14 1125  BP: 116/64  Pulse: 58  Temp: 97.5 F (36.4 C)  TempSrc: Oral  Weight: 118 lb 9.6 oz (53.797 kg)  SpO2: 96%  Physical Exam  Constitutional: She is oriented to person, place, and time. No distress.  Thin white female nad  HENT:  Head: Normocephalic and atraumatic.  Cardiovascular: Normal rate, regular rhythm, normal heart sounds and intact distal pulses.   Pulmonary/Chest: Effort normal and breath sounds normal. No respiratory distress.  Abdominal: Soft. Bowel sounds are normal. She exhibits no distension and no mass. There is no tenderness.  Musculoskeletal:  Has unsteady gait with short leg and pain in right hip and knee  Neurological: She is alert and oriented to person, place, and time.  Skin: Skin is warm and dry.  Stuck on papules of various sizes on her back, new ones on her face, neck  Psychiatric: She has a normal mood and affect.    Labs reviewed: Basic Metabolic Panel:  Recent Labs  05/11/13 1707  NA 141  K 4.6  CL 103  CO2 25  GLUCOSE 110*  BUN 23  CREATININE 0.63  CALCIUM 9.7   Liver Function Tests:  Recent Labs  05/11/13 1707  AST 16  ALT 10  ALKPHOS 70  BILITOT 0.4  PROT 6.5  CBC:  Recent Labs  05/11/13 1707  WBC 4.6  NEUTROABS 2.2  HGB 12.4  HCT 37.3  MCV 95    Assessment/Plan 1. Seborrheic keratoses -has several new ones -does not want intervention at this point--advised she should see derm if she wants these removed from her face, but I could use cryopen on some others if she desires if they give her problems  2. Benign paroxysmal positional vertigo, unspecified laterality - very short lived in mornings - CBC With differential/Platelet; Future - Comprehensive metabolic panel; Future  3. Pseudogout of right knee - helped significantly with voltaren gel - Comprehensive metabolic panel; Future - diclofenac sodium (VOLTAREN) 1 % GEL; Apply 4 g topically 4 (  four) times daily. For right knee and hip arthritis  Dispense: 100 g; Refill: 3  4. Unspecified essential hypertension -bp at goal, no changes - CBC With differential/Platelet; Future - Comprehensive metabolic panel; Future  5. Gait instability -cont to use rollator walker  6. Need for vaccination with 13-polyvalent pneumococcal conjugate vaccine -prevnar vaccine given today  7. Thyrotoxicosis without mention of goiter or other cause, without mention of thyrotoxic crisis or storm - TSH; Future   8. Primary osteoarthritis of right hip -stable with current treatments - diclofenac sodium (VOLTAREN) 1 % GEL; Apply 4 g topically 4 (four) times daily. For right knee and hip arthritis  Dispense: 100 g; Refill: 3  10. Need for prophylactic vaccination with combined diphtheria-tetanus-pertussis (DTP) vaccine -prescription given for tdap to get at her pharmacy   Labs/tests ordered: Orders Placed This Encounter  Procedures  . CBC With differential/Platelet    Standing Status: Future     Number of Occurrences:      Standing Expiration Date: 09/22/2014  . Comprehensive metabolic panel    Standing Status: Future     Number of Occurrences:      Standing Expiration Date: 09/22/2014  . TSH    Standing Status: Future     Number of Occurrences:      Standing Expiration Date: 09/22/2014     Next appt: 4 mos annual exam

## 2014-03-25 DIAGNOSIS — H6120 Impacted cerumen, unspecified ear: Secondary | ICD-10-CM | POA: Diagnosis not present

## 2014-04-23 DIAGNOSIS — Z23 Encounter for immunization: Secondary | ICD-10-CM | POA: Diagnosis not present

## 2014-06-11 ENCOUNTER — Other Ambulatory Visit: Payer: Self-pay | Admitting: *Deleted

## 2014-06-11 DIAGNOSIS — M1611 Unilateral primary osteoarthritis, right hip: Secondary | ICD-10-CM

## 2014-06-11 DIAGNOSIS — M11261 Other chondrocalcinosis, right knee: Secondary | ICD-10-CM

## 2014-06-11 MED ORDER — DICLOFENAC SODIUM 1 % TD GEL: 4.0000 g | Freq: Four times a day (QID) | TRANSDERMAL | Status: DC

## 2014-06-11 NOTE — Telephone Encounter (Signed)
[facility] 

## 2014-06-11 NOTE — Telephone Encounter (Signed)
Patient Requested. Phoned into pharmacy

## 2014-08-10 DIAGNOSIS — L821 Other seborrheic keratosis: Secondary | ICD-10-CM | POA: Diagnosis not present

## 2014-08-10 DIAGNOSIS — L820 Inflamed seborrheic keratosis: Secondary | ICD-10-CM | POA: Diagnosis not present

## 2014-08-10 DIAGNOSIS — L309 Dermatitis, unspecified: Secondary | ICD-10-CM | POA: Diagnosis not present

## 2014-08-31 ENCOUNTER — Other Ambulatory Visit: Payer: Medicare Other

## 2014-09-01 ENCOUNTER — Other Ambulatory Visit: Payer: Medicare Other

## 2014-09-01 DIAGNOSIS — H8110 Benign paroxysmal vertigo, unspecified ear: Secondary | ICD-10-CM | POA: Diagnosis not present

## 2014-09-01 DIAGNOSIS — E0590 Thyrotoxicosis, unspecified without thyrotoxic crisis or storm: Secondary | ICD-10-CM

## 2014-09-01 DIAGNOSIS — I10 Essential (primary) hypertension: Secondary | ICD-10-CM | POA: Diagnosis not present

## 2014-09-01 DIAGNOSIS — M11861 Other specified crystal arthropathies, right knee: Secondary | ICD-10-CM | POA: Diagnosis not present

## 2014-09-01 DIAGNOSIS — M11261 Other chondrocalcinosis, right knee: Secondary | ICD-10-CM

## 2014-09-01 DIAGNOSIS — M11869 Other specified crystal arthropathies, unspecified knee: Secondary | ICD-10-CM | POA: Diagnosis not present

## 2014-09-02 ENCOUNTER — Encounter: Payer: Self-pay | Admitting: Internal Medicine

## 2014-09-02 LAB — CBC WITH DIFFERENTIAL
Basophils Absolute: 0 10*3/uL (ref 0.0–0.2)
Basos: 0 %
Eos: 6 %
Eosinophils Absolute: 0.3 10*3/uL (ref 0.0–0.4)
HCT: 38.5 % (ref 34.0–46.6)
Hemoglobin: 13.2 g/dL (ref 11.1–15.9)
Immature Grans (Abs): 0 10*3/uL (ref 0.0–0.1)
Immature Granulocytes: 0 %
Lymphocytes Absolute: 1.5 10*3/uL (ref 0.7–3.1)
Lymphs: 34 %
MCH: 33 pg (ref 26.6–33.0)
MCHC: 34.3 g/dL (ref 31.5–35.7)
MCV: 96 fL (ref 79–97)
Monocytes Absolute: 0.5 10*3/uL (ref 0.1–0.9)
Monocytes: 11 %
Neutrophils Absolute: 2.2 10*3/uL (ref 1.4–7.0)
Neutrophils Relative %: 49 %
RBC: 4 x10E6/uL (ref 3.77–5.28)
RDW: 13.1 % (ref 12.3–15.4)
WBC: 4.5 10*3/uL (ref 3.4–10.8)

## 2014-09-02 LAB — COMPREHENSIVE METABOLIC PANEL
ALT: 11 IU/L (ref 0–32)
AST: 17 IU/L (ref 0–40)
Albumin/Globulin Ratio: 1.9 (ref 1.1–2.5)
Albumin: 4.3 g/dL (ref 3.5–4.7)
Alkaline Phosphatase: 58 IU/L (ref 39–117)
BUN/Creatinine Ratio: 47 — ABNORMAL HIGH (ref 11–26)
BUN: 27 mg/dL (ref 8–27)
Bilirubin Total: 0.6 mg/dL (ref 0.0–1.2)
CO2: 22 mmol/L (ref 18–29)
Calcium: 9.8 mg/dL (ref 8.7–10.3)
Chloride: 103 mmol/L (ref 97–108)
Creatinine, Ser: 0.57 mg/dL (ref 0.57–1.00)
GFR calc Af Amer: 95 mL/min/{1.73_m2} (ref >59)
GFR calc non Af Amer: 82 mL/min/{1.73_m2} (ref >59)
Globulin, Total: 2.3 g/dL (ref 1.5–4.5)
Glucose: 84 mg/dL (ref 65–99)
Potassium: 4.6 mmol/L (ref 3.5–5.2)
Sodium: 142 mmol/L (ref 134–144)
Total Protein: 6.6 g/dL (ref 6.0–8.5)

## 2014-09-02 LAB — TSH: TSH: 2.21 u[IU]/mL (ref 0.450–4.500)

## 2014-09-06 ENCOUNTER — Encounter: Payer: Self-pay | Admitting: Internal Medicine

## 2014-09-06 ENCOUNTER — Ambulatory Visit (INDEPENDENT_AMBULATORY_CARE_PROVIDER_SITE_OTHER): Payer: Medicare Other | Admitting: Internal Medicine

## 2014-09-06 VITALS — BP 118/76 | HR 94 | Temp 97.2°F | Resp 18

## 2014-09-06 DIAGNOSIS — M11861 Other specified crystal arthropathies, right knee: Secondary | ICD-10-CM | POA: Diagnosis not present

## 2014-09-06 DIAGNOSIS — L259 Unspecified contact dermatitis, unspecified cause: Secondary | ICD-10-CM | POA: Diagnosis not present

## 2014-09-06 DIAGNOSIS — M1611 Unilateral primary osteoarthritis, right hip: Secondary | ICD-10-CM

## 2014-09-06 DIAGNOSIS — L821 Other seborrheic keratosis: Secondary | ICD-10-CM | POA: Diagnosis not present

## 2014-09-06 DIAGNOSIS — M11261 Other chondrocalcinosis, right knee: Secondary | ICD-10-CM

## 2014-09-06 MED ORDER — DICLOFENAC SODIUM 1 % TD GEL: 4.0000 g | Freq: Four times a day (QID) | TRANSDERMAL | Status: DC

## 2014-09-06 NOTE — Progress Notes (Signed)
Patient ID: Stacey Clay, female   DOB: 03-Aug-1922, 81 y.o.   MRN: [id number]   Location:  [facility]  Code Status: Advanced Directive information Does patient have an advance directive?: No, Would patient like information on creating an advanced directive?: Yes - Educational materials given (gave her the form several visits ago; she tried to get it notarized at the bank but they said they don't notarize those documents there)   Allergies  Allergen Reactions  . Aleve [Naproxen Sodium]     Stomach bleeding  . Aspirin     Stomach bleeding  . Darvocet [Propoxyphene N-Acetaminophen]     Stomach bleeding  . Darvon [Propoxyphene Hcl]     Stomach bleeding  . Lomotil [Diphenoxylate] Nausea And Vomiting  . Ultram [Tramadol] Nausea And Vomiting    Chief Complaint  Patient presents with  . Annual Exam    dermatitis, rt knee and hip is worse,    HPI: Patient is a 81 y.o. white female seen in the office today for her annual exam.  Has gotten hearing aides.    Had difficulty getting in due to needing physical and her son having to bring her.  Takes two tylenol 650mg  at bedtime and two in the am and uses voltaren with great success at hs.  Both right knee and hip are getting worse that 6 mos ago.  Will need prior auth she says for the voltaren.  She went to [facility], Dr. Wilhemina Bonito, who said she has dermatitis.  He recommended burning them off and she didn't want that done since they are all over her back.  Has gotten new detergent and soap that are hypoallergenic, but didn't use them yet.  Discussed low dose hydrocortisone cream--has cortisone cream 10.    Review of Systems:  Review of Systems  Constitutional: Negative for fever, chills and weight loss.  HENT: Positive for hearing loss. Negative for congestion.   Eyes: Negative for blurred vision.       Wears glasses, knows she is getting cataracts, but not ripe yet  Respiratory:  Negative for shortness of breath.   Cardiovascular: Negative for chest pain and leg swelling.  Gastrointestinal: Negative for abdominal pain, diarrhea, constipation, blood in stool and melena.  Genitourinary: Negative for dysuria, urgency and frequency.       Had one accident when leaving church (had not gone in 3 hrs)--decided to wear pantiliners--has some leakage here and there  Musculoskeletal: Positive for joint pain. Negative for falls.       Right knee, hip; has deformities of fingers  Skin: Positive for itching and rash.  Neurological: Negative for dizziness and loss of consciousness.       Rare vertigo in the morning  Psychiatric/Behavioral: Negative for depression and memory loss. The patient is not nervous/anxious.        Admits to forgetting a name once a while     Past Medical History  Diagnosis Date  . Contracture of lower leg joint   . Pain in joint, pelvic region and thigh   . Disturbance of skin sensation   . Pain in joint, lower leg   . Restless legs syndrome (RLS)   . Disorder of bone and cartilage, unspecified   . Unspecified cataract     not surgically ready  . Complication of anesthesia   . PONV (postoperative nausea and vomiting)   . Thyrotoxicosis without mention of goiter or other cause, without mention of thyrotoxic  crisis or storm     tx. radioactive Iodine s/p yrs after having thyroid goiter surgery  . Arthritis     Osteoarthritis -knees, hips-"pseudo gout right knee" "curvature of spine"  . Gait instability     due to contractures and curvature to spine uses-walker to ambulate  . Hypoglycemia, unspecified   . Hypoglycemia     Past Surgical History  Procedure Laterality Date  . Tonsillectomy  1932  . Goiter resection  1958  . Cholecystectomy    . Total hip arthroplasty Left 11/28/2012    Procedure: LEFT TOTAL HIP ARTHROPLASTY ANTERIOR APPROACH;  Surgeon: Mcarthur Rossetti, MD;  Location: [facility];  Service: Orthopedics;  Laterality: Left;     Social History:   reports that she has never smoked. She does not have any smokeless tobacco history on file. She reports that she does not drink alcohol or use illicit drugs.  Family History  Problem Relation Age of Onset  . Stroke Mother   . Stroke Father     Medications: Patient's Medications  New Prescriptions   No medications on file  Previous Medications   ACETAMINOPHEN (TYLENOL) 650 MG CR TABLET    Take 2 tablets  (650 mg) in the evening for pain   DICLOFENAC SODIUM (VOLTAREN) 1 % GEL    Apply 4 g topically 4 (four) times daily. For right knee and hip arthritis   MULTIPLE VITAMIN (MULTIVITAMIN) TABLET    Take 1 tablet by mouth daily.  Modified Medications   No medications on file  Discontinued Medications   No medications on file     Physical Exam: Filed Vitals:   09/06/14 1406  BP: 118/76  Pulse: 94  Temp: 97.2 F (36.2 C)  TempSrc: Oral  Resp: 18  SpO2: 93%  Physical Exam  Constitutional: She is oriented to person, place, and time.  Thin white female  HENT:  Head: Normocephalic and atraumatic.  Right Ear: External ear normal.  Left Ear: External ear normal.  Nose: Nose normal.  Mouth/Throat: Oropharynx is clear and moist. No oropharyngeal exudate.  Left hearing aide, cerumen in right canal  Eyes: Conjunctivae and EOM are normal. Pupils are equal, round, and reactive to light.  glasses  Neck: Normal range of motion. Neck supple. No JVD present.  Cardiovascular: Normal rate, regular rhythm, normal heart sounds and intact distal pulses.   Pulmonary/Chest: Effort normal and breath sounds normal. Right breast exhibits no inverted nipple, no mass, no nipple discharge, no skin change and no tenderness. Left breast exhibits no inverted nipple, no mass, no nipple discharge, no skin change and no tenderness.  Abdominal: Soft. Bowel sounds are normal. She exhibits no distension and no mass. There is no tenderness.  Musculoskeletal: She exhibits tenderness.   Right knee and hip and walks slowly with rollator walker, mostly gets pushed  Neurological: She is alert and oriented to person, place, and time.  Skin: Skin is warm and dry.  Mild papular rash of chest and back and upper arm; also has numerous stuck on papules over chest and back  Psychiatric: She has a normal mood and affect. Her behavior is normal. Judgment and thought content normal.    Labs reviewed: Basic Metabolic Panel:  Recent Labs  09/01/14 1104  NA 142  K 4.6  CL 103  CO2 22  GLUCOSE 84  BUN 27  CREATININE 0.57  CALCIUM 9.8  TSH 2.210   Liver Function Tests:  Recent Labs  09/01/14 1104  AST 17  ALT  11  ALKPHOS 58  BILITOT 0.6  PROT 6.6   No results for input(s): LIPASE, AMYLASE in the last 8760 hours. No results for input(s): AMMONIA in the last 8760 hours. CBC:  Recent Labs  09/01/14 1104  WBC 4.5  NEUTROABS 2.2  HGB 13.2  HCT 38.5  MCV 96   Lipid Panel: No results for input(s): CHOL, HDL, LDLCALC, TRIG, CHOLHDL, LDLDIRECT in the last 8760 hours. No results found for: HGBA1C  Assessment/Plan 1. Pseudogout of right knee -cont voltaren and tylenol - diclofenac sodium (VOLTAREN) 1 % GEL; Apply 4 g topically 4 (four) times daily. For right knee and hip arthritis  Dispense: 100 g; Refill: 3 - CBC with Differential/Platelet; Future - Comprehensive metabolic panel; Future  2. Primary osteoarthritis of right hip -cont voltaren and tylenol - diclofenac sodium (VOLTAREN) 1 % GEL; Apply 4 g topically 4 (four) times daily. For right knee and hip arthritis  Dispense: 100 g; Refill: 3 - CBC with Differential/Platelet; Future - Comprehensive metabolic panel; Future  3. Seborrheic keratoses -advised that options are leaving them alone or having them frozen or burned off - CBC with Differential/Platelet; Future - Comprehensive metabolic panel; Future  4. Contact dermatitis -recommended otc hydrocortisone until she changes to hypoallergenic detergent  and soap - CBC with Differential/Platelet; Future - Comprehensive metabolic panel; Future  Labs/tests ordered:   Orders Placed This Encounter  Procedures  . CBC with Differential/Platelet    Standing Status: Future     Number of Occurrences:      Standing Expiration Date: 09/06/2015  . Comprehensive metabolic panel    Standing Status: Future     Number of Occurrences:      Standing Expiration Date: 09/06/2015    Next appt:  6 mos  Stacey Clay L. Cataleah Stites, D.O. [facility] [address], [place] [address] Cell Phone (Mon-Fri 8am-5pm):  [phone] On Call:  [phone] & follow prompts after 5pm & weekends Office Phone:  [phone] Office Fax:  [phone]

## 2014-09-06 NOTE — Patient Instructions (Signed)
Please get advance directive notarized and bring [place] a copy Use cortisone for dermatitis until you change to the hypoallergenic detergent

## 2014-09-23 IMAGING — CR DG PELVIS 1-2V
2 series · 2 of 2 positions shown · non-contrast
Comparison: Pelvis film of 03/10/2005

CLINICAL DATA: Worsening of pain, decreased range of motion

PELVIS - 1-2 VIEW

[view not recorded (1 of 2)]
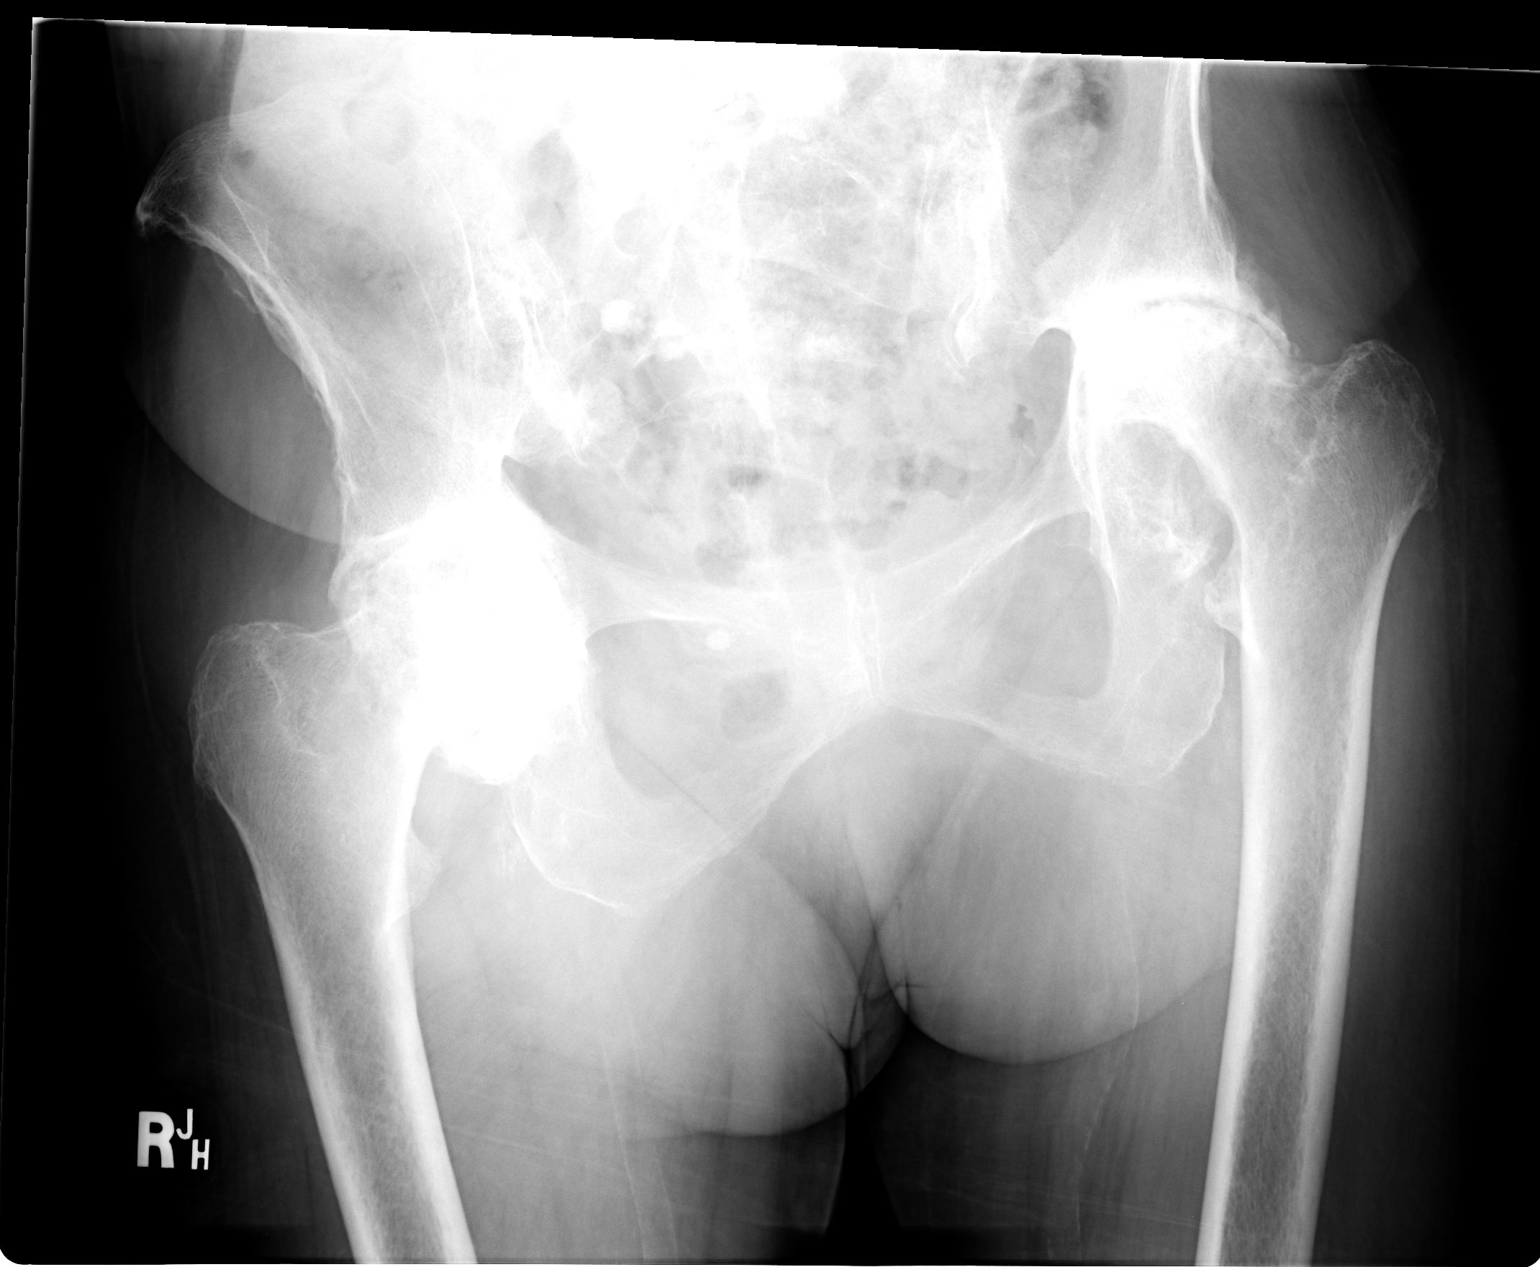

[view not recorded (2 of 2)]
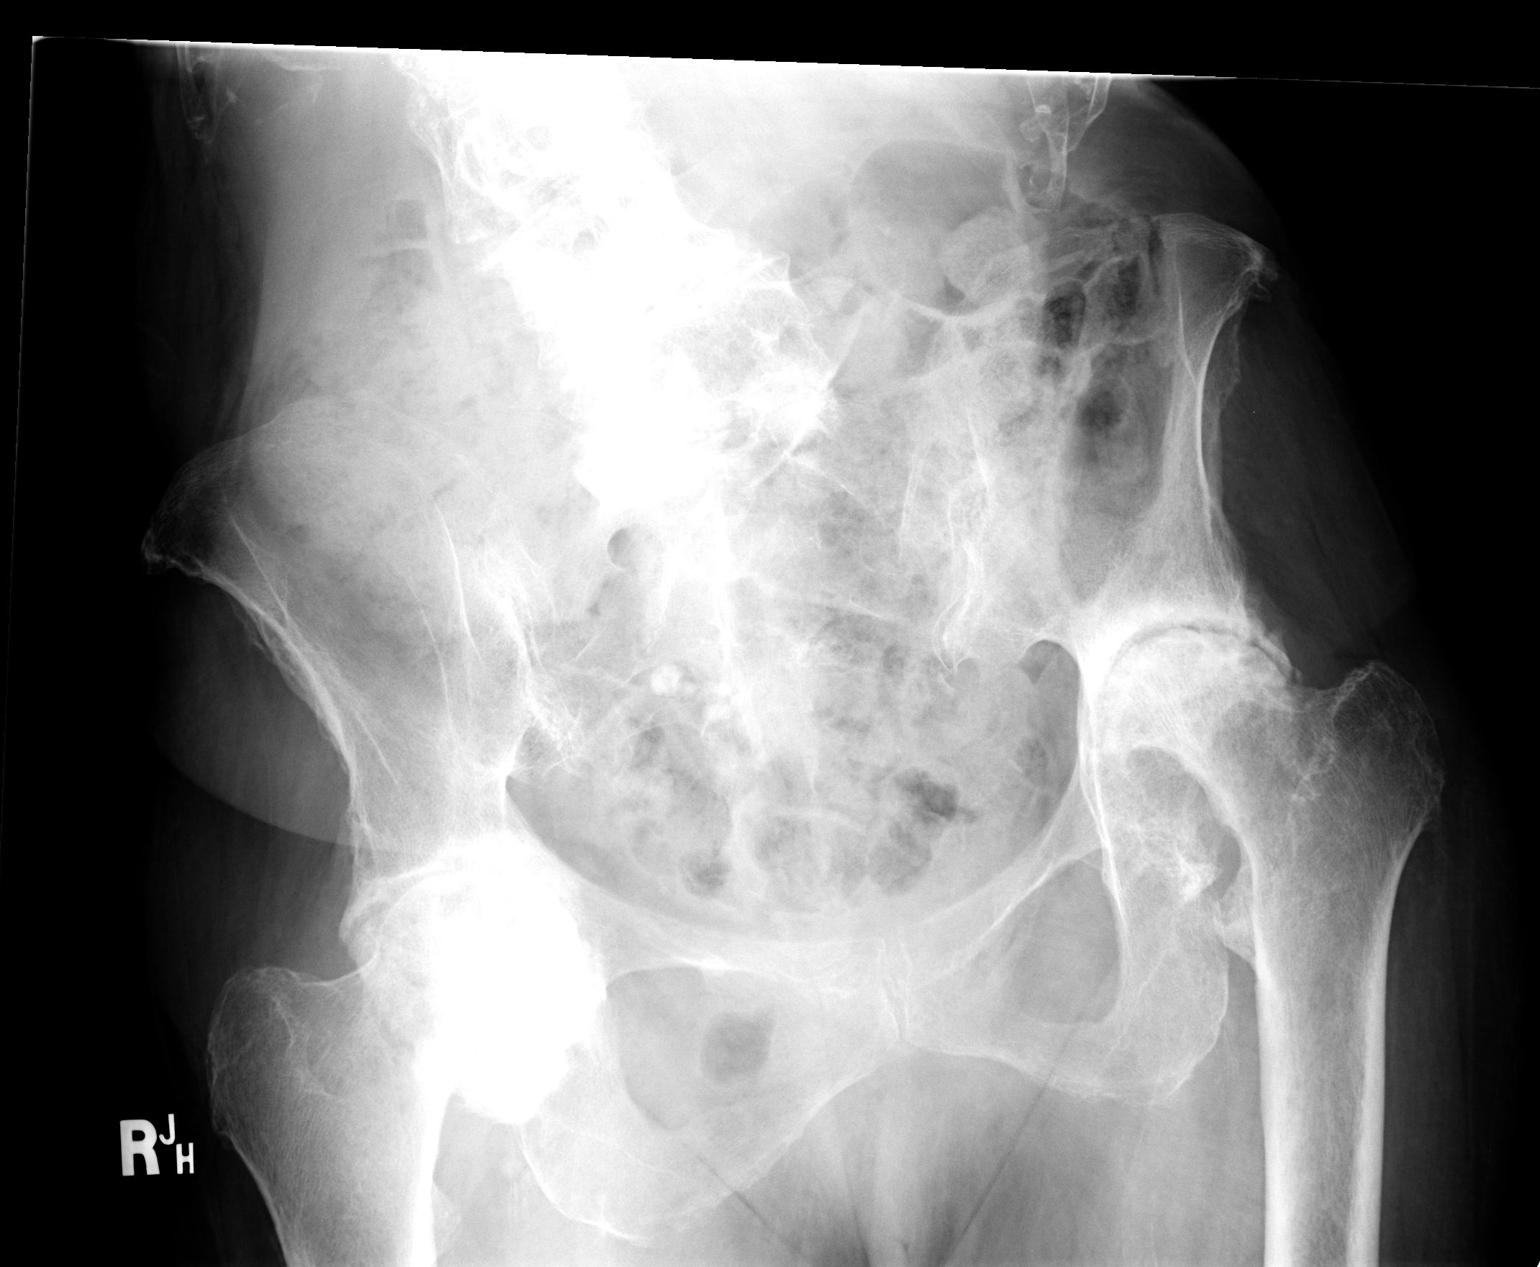

[2 of 2 positions shown; findings below may reference images not displayed]

FINDINGS: There is been significant progression of the degenerative
joint disease described previously involving both hips.  There is
considerable loss of joint space, protrusion acetabuli, sclerosis
and spurring.  No acute fracture is seen.  The pelvic rami are
intact.  The SI joints are unremarkable.  Lumbar scoliosis is
noted.
IMPRESSION: Significant progression of degenerative joint disease involving
both hips.

## 2014-09-23 IMAGING — CR DG HIP (WITH OR WITHOUT PELVIS) 2-3V*L*
2 series · 2 of 2 positions shown · non-contrast
Comparison: Left hip films of 03/10/2005

CLINICAL DATA: Progression of pain, limited range of motion which

LEFT HIP - COMPLETE 2+ VIEW

[view not recorded (1 of 2)]
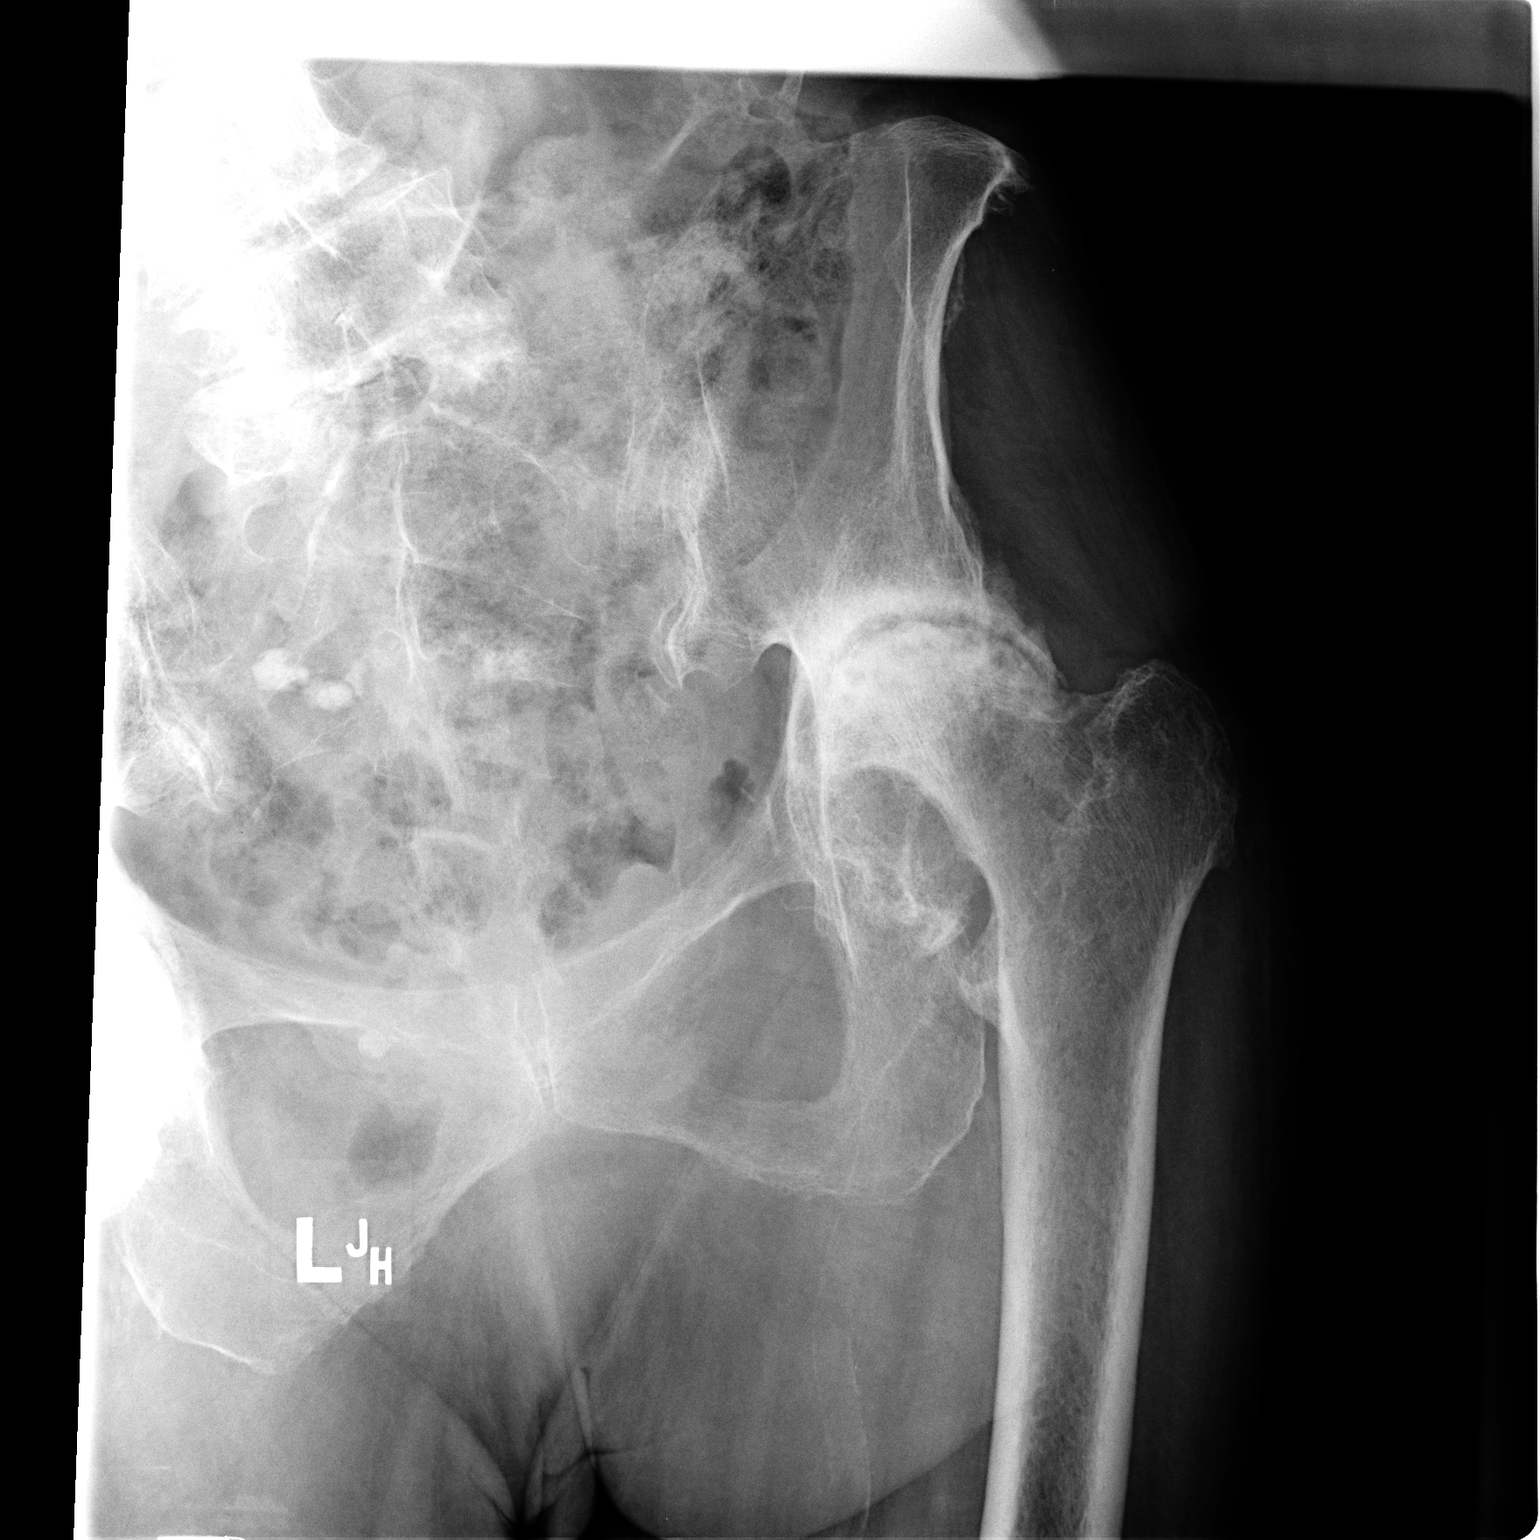

[view not recorded (2 of 2)]
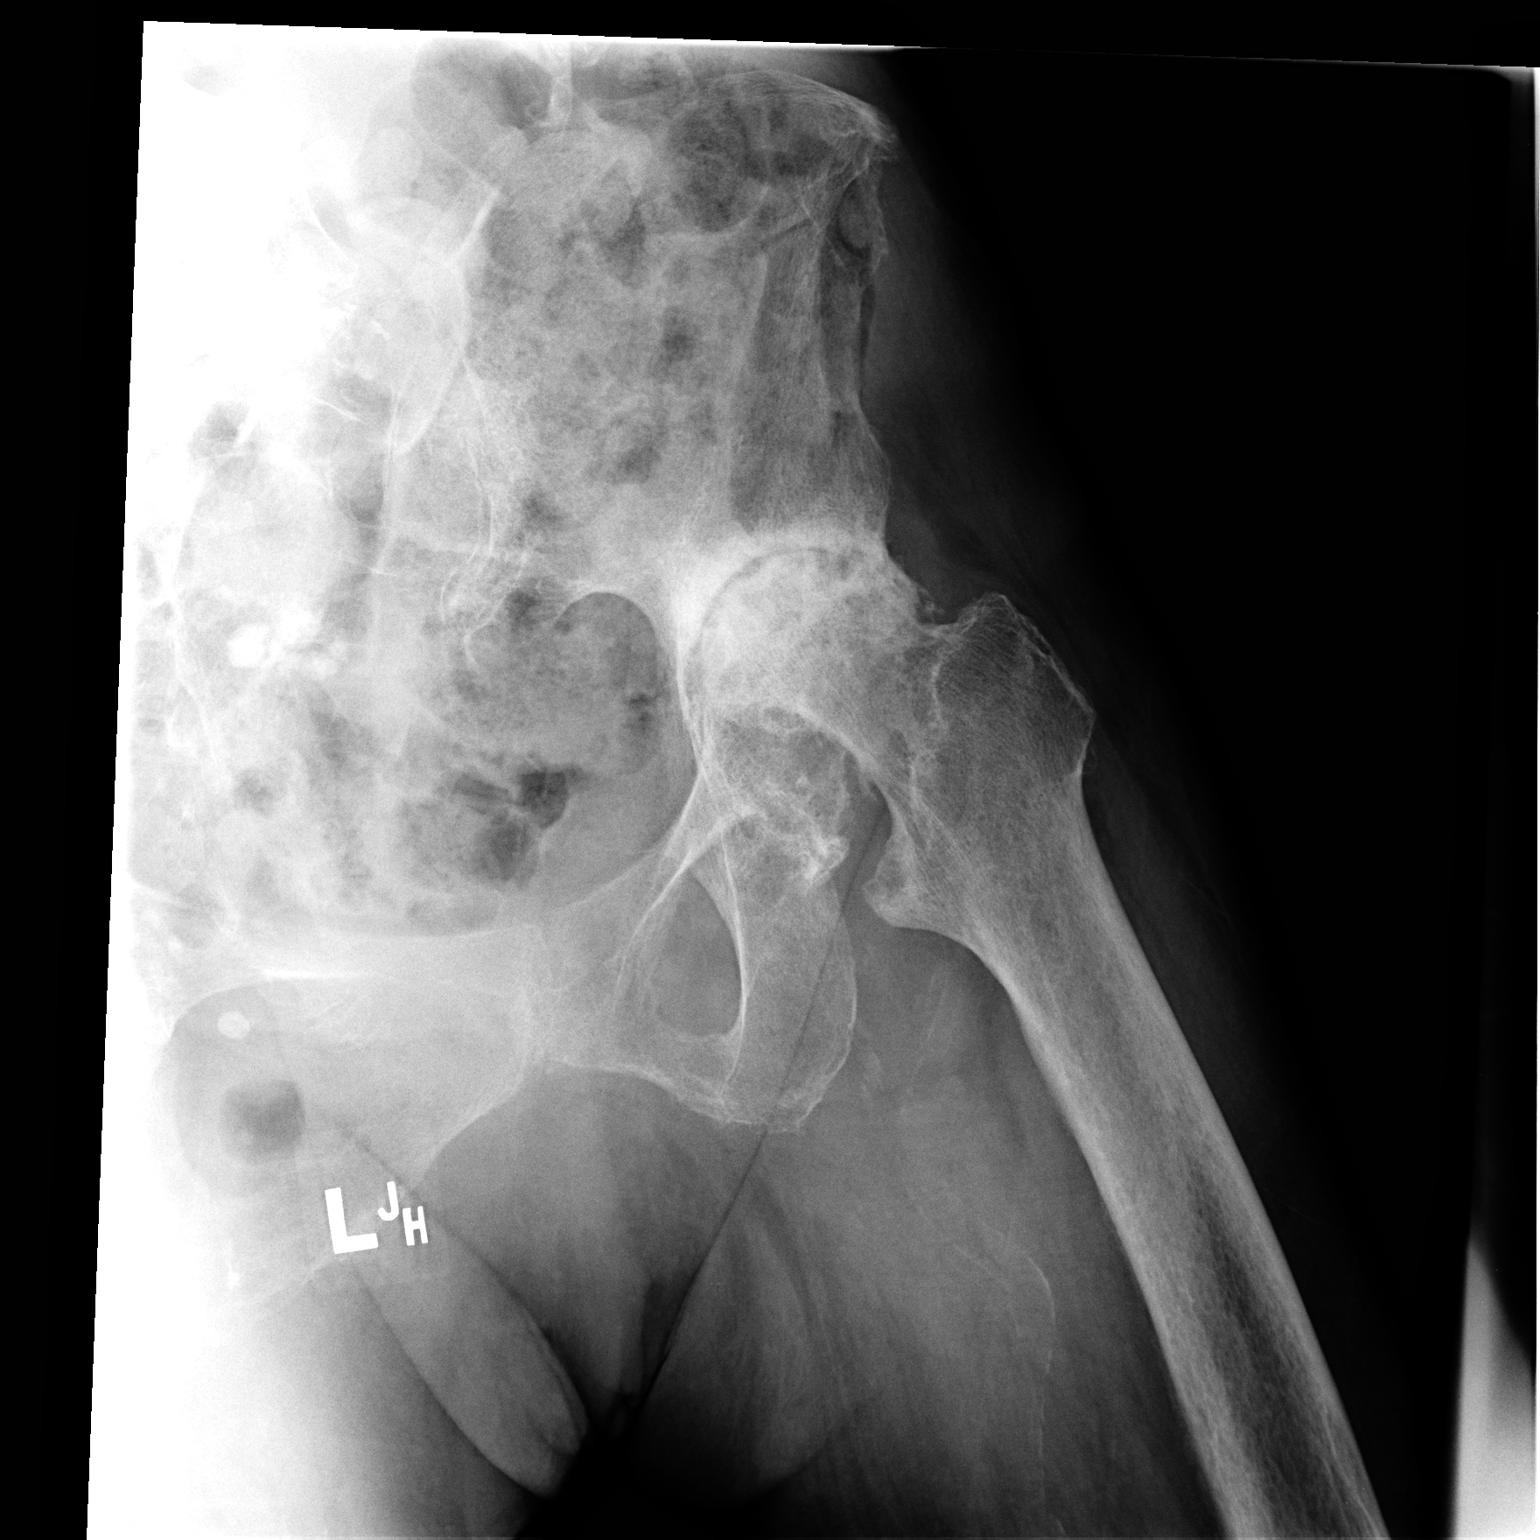

[2 of 2 positions shown; findings below may reference images not displayed]

FINDINGS: There has been significant progression of degenerative
joint disease involving the left hip.  The left femoral head is
deformed, and there is considerable sclerosis, spurring, and
subchondral cyst formation.  No acute fracture is seen.
IMPRESSION: Significant worsening of severe degenerative joint disease
involving the left hip

## 2014-10-28 DIAGNOSIS — H6123 Impacted cerumen, bilateral: Secondary | ICD-10-CM | POA: Diagnosis not present

## 2014-11-29 ENCOUNTER — Other Ambulatory Visit: Payer: Self-pay | Admitting: *Deleted

## 2014-11-29 DIAGNOSIS — M1611 Unilateral primary osteoarthritis, right hip: Secondary | ICD-10-CM

## 2014-11-29 DIAGNOSIS — M11261 Other chondrocalcinosis, right knee: Secondary | ICD-10-CM

## 2014-11-29 MED ORDER — DICLOFENAC SODIUM 1 % TD GEL: 4.0000 g | Freq: Four times a day (QID) | TRANSDERMAL | Status: DC

## 2014-11-29 NOTE — Telephone Encounter (Signed)
[facility] 

## 2014-12-05 IMAGING — CR DG PORTABLE PELVIS
1 series · 1 of 1 positions shown · non-contrast
Comparison: 09/16/2012

CLINICAL DATA: Postop left hip replacement

PORTABLE PELVIS

[AP]
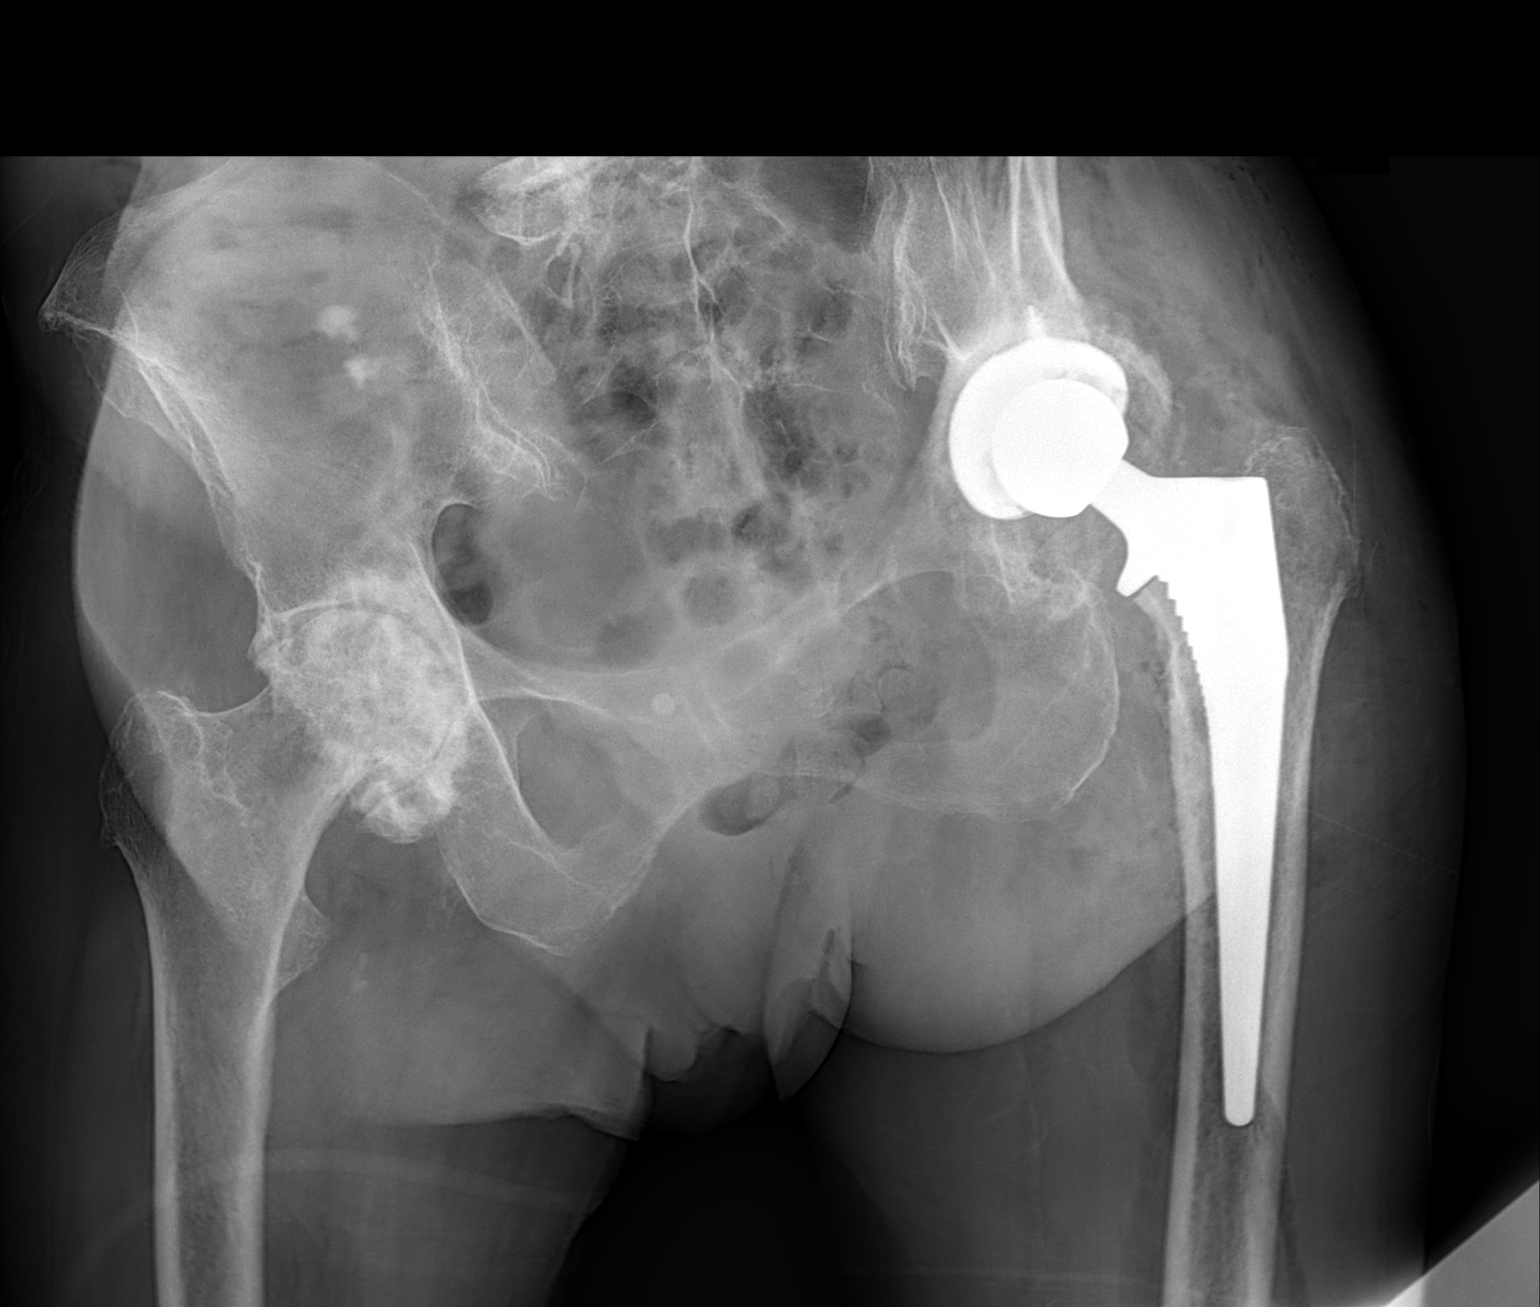

[1 of 1 positions shown; findings below may reference images not displayed]

FINDINGS: Frontal view the pelvis shows the patient be status post
left total hip replacement.  No evidence for immediate hardware
complications.  The advanced generative change are noted in the
right hip.  Bones are diffusely demineralized.
IMPRESSION: Status post left total hip replacement without evidence for
immediate hardware complications.

## 2015-02-04 ENCOUNTER — Other Ambulatory Visit: Payer: Self-pay | Admitting: *Deleted

## 2015-02-04 DIAGNOSIS — M11261 Other chondrocalcinosis, right knee: Secondary | ICD-10-CM

## 2015-02-04 DIAGNOSIS — M1611 Unilateral primary osteoarthritis, right hip: Secondary | ICD-10-CM

## 2015-02-04 MED ORDER — DICLOFENAC SODIUM 1 % TD GEL: 4.0000 g | Freq: Four times a day (QID) | TRANSDERMAL | Status: DC

## 2015-02-04 NOTE — Telephone Encounter (Signed)
Patient requested to be faxed to pharmacy 

## 2015-02-04 NOTE — Telephone Encounter (Signed)
[facility] 

## 2015-03-03 ENCOUNTER — Other Ambulatory Visit: Payer: Medicare Other

## 2015-03-07 ENCOUNTER — Ambulatory Visit: Payer: Medicare Other | Admitting: Internal Medicine

## 2015-04-13 ENCOUNTER — Other Ambulatory Visit: Payer: Medicare Other

## 2015-04-13 DIAGNOSIS — M11261 Other chondrocalcinosis, right knee: Secondary | ICD-10-CM

## 2015-04-13 DIAGNOSIS — L259 Unspecified contact dermatitis, unspecified cause: Secondary | ICD-10-CM | POA: Diagnosis not present

## 2015-04-13 DIAGNOSIS — L821 Other seborrheic keratosis: Secondary | ICD-10-CM | POA: Diagnosis not present

## 2015-04-13 DIAGNOSIS — M1611 Unilateral primary osteoarthritis, right hip: Secondary | ICD-10-CM | POA: Diagnosis not present

## 2015-04-13 DIAGNOSIS — M11861 Other specified crystal arthropathies, right knee: Secondary | ICD-10-CM | POA: Diagnosis not present

## 2015-04-14 LAB — CBC WITH DIFFERENTIAL/PLATELET
Basophils Absolute: 0 10*3/uL (ref 0.0–0.2)
Basos: 0 %
EOS (ABSOLUTE): 0.3 10*3/uL (ref 0.0–0.4)
Eos: 6 %
Hematocrit: 37.8 % (ref 34.0–46.6)
Hemoglobin: 13.1 g/dL (ref 11.1–15.9)
Immature Grans (Abs): 0 10*3/uL (ref 0.0–0.1)
Immature Granulocytes: 0 %
Lymphocytes Absolute: 1.5 10*3/uL (ref 0.7–3.1)
Lymphs: 33 %
MCH: 33.3 pg — ABNORMAL HIGH (ref 26.6–33.0)
MCHC: 34.7 g/dL (ref 31.5–35.7)
MCV: 96 fL (ref 79–97)
Monocytes Absolute: 0.4 10*3/uL (ref 0.1–0.9)
Monocytes: 8 %
Neutrophils Absolute: 2.4 10*3/uL (ref 1.4–7.0)
Neutrophils: 53 %
Platelets: 267 10*3/uL (ref 150–379)
RBC: 3.93 x10E6/uL (ref 3.77–5.28)
RDW: 12.8 % (ref 12.3–15.4)
WBC: 4.5 10*3/uL (ref 3.4–10.8)

## 2015-04-14 LAB — COMPREHENSIVE METABOLIC PANEL
ALT: 12 IU/L (ref 0–32)
AST: 18 IU/L (ref 0–40)
Albumin/Globulin Ratio: 2 (ref 1.1–2.5)
Albumin: 4.3 g/dL (ref 3.2–4.6)
Alkaline Phosphatase: 53 IU/L (ref 39–117)
BUN/Creatinine Ratio: 44 — ABNORMAL HIGH (ref 11–26)
BUN: 23 mg/dL (ref 10–36)
Bilirubin Total: 0.5 mg/dL (ref 0.0–1.2)
CO2: 22 mmol/L (ref 18–29)
Calcium: 9.5 mg/dL (ref 8.7–10.3)
Chloride: 104 mmol/L (ref 97–108)
Creatinine, Ser: 0.52 mg/dL — ABNORMAL LOW (ref 0.57–1.00)
GFR calc Af Amer: 97 mL/min/{1.73_m2} (ref >59)
GFR calc non Af Amer: 84 mL/min/{1.73_m2} (ref >59)
Globulin, Total: 2.2 g/dL (ref 1.5–4.5)
Glucose: 86 mg/dL (ref 65–99)
Potassium: 4.5 mmol/L (ref 3.5–5.2)
Sodium: 141 mmol/L (ref 134–144)
Total Protein: 6.5 g/dL (ref 6.0–8.5)

## 2015-04-18 ENCOUNTER — Ambulatory Visit (INDEPENDENT_AMBULATORY_CARE_PROVIDER_SITE_OTHER): Payer: Medicare Other | Admitting: Internal Medicine

## 2015-04-18 ENCOUNTER — Encounter: Payer: Self-pay | Admitting: Internal Medicine

## 2015-04-18 VITALS — BP 150/80 | HR 82 | Temp 97.9°F | Resp 20 | Ht 65.0 in | Wt 122.8 lb

## 2015-04-18 DIAGNOSIS — M1611 Unilateral primary osteoarthritis, right hip: Secondary | ICD-10-CM

## 2015-04-18 DIAGNOSIS — M11861 Other specified crystal arthropathies, right knee: Secondary | ICD-10-CM

## 2015-04-18 DIAGNOSIS — R2681 Unsteadiness on feet: Secondary | ICD-10-CM | POA: Diagnosis not present

## 2015-04-18 DIAGNOSIS — M11261 Other chondrocalcinosis, right knee: Secondary | ICD-10-CM

## 2015-04-18 DIAGNOSIS — Z23 Encounter for immunization: Secondary | ICD-10-CM | POA: Diagnosis not present

## 2015-04-18 DIAGNOSIS — L821 Other seborrheic keratosis: Secondary | ICD-10-CM | POA: Diagnosis not present

## 2015-04-18 DIAGNOSIS — E162 Hypoglycemia, unspecified: Secondary | ICD-10-CM

## 2015-04-18 MED ORDER — BUPRENORPHINE 5 MCG/HR TD PTWK: 5.0000 ug | MEDICATED_PATCH | TRANSDERMAL | Status: DC

## 2015-04-18 MED ORDER — DICLOFENAC SODIUM 1 % TD GEL: 4.0000 g | Freq: Four times a day (QID) | TRANSDERMAL | Status: DC

## 2015-04-18 NOTE — Progress Notes (Signed)
Patient ID: Stacey Clay, female   DOB: 05-13-1923, 81 y.o.   MRN: [id number]   Location: [facility] Provider: Rexene Edison. Mariea Clonts, D.O., C.M.D.  Code Status: DNR Goals of Care: Advanced Directive information Does patient have an advance directive?: Yes, Type of Advance Directive: [place];Living will, Does patient want to make changes to advanced directive?: No - Patient declined MOST was done today and scanned  Chief Complaint  Patient presents with  . Medical Management of Chronic Issues    right knee and hip pain when walking    HPI: Patient is a 81 y.o. female seen in the office today for med mgt of chronic conditions.  Right knee and right hip hurt when walking so she moves dreadfully slowly around her home.  Uses voltaren gel at night and it's very helpful using it--able to sleep for 7 hrs.  Didn't work when she tried it in the daytime.  Walker and bottom are both not very padded, she says.  Also has her hernia, which does not bother her.  Several upset stomach episodes from darvocet, darvon and tramadol.  Aleve and aspirin caused stomach bleeding.  Pain is the same as it's been for the past month--needs something done.  Also using her tylenol scheduled.    BP was actually elevated today due to pain.  Had red blister on left thigh.  Went to derm and treated with triamcinolone.  Got another Rx from derm.  Has stayed red.  Wants to stop the cream again, but unsure b/c came back before.    Also has actinic keratosis on left knee she's asking about.  Wondered if she should scrape it off, but advised to leave it alone.   Feels her cspine rubbing together.  When pushes her walker and turns her head, it goes pop pop.    Has h/o hypoglycemia in the distant past.  Feels like recently, it's been a problem.  Has always managed it by diet.  Teeth start to chatter when her glucose is low.  She's able to stop them.  Resolves immediately after her morning black coffee  with sugar.  Also puts a little sugar on her cereal in the morning.  Has cookies or candy with supper.  That has been working ok.  Had also been waking up foggy headed, but resolved with the coffee.  Review of Systems:  Review of Systems  Constitutional: Positive for malaise/fatigue. Negative for fever and chills.  Eyes: Negative for blurred vision.       Glasses  Respiratory: Negative for shortness of breath.   Cardiovascular: Negative for chest pain.  Gastrointestinal: Negative for abdominal pain, constipation, blood in stool and melena.       Hernia  Genitourinary: Negative for dysuria.  Musculoskeletal: Positive for joint pain. Negative for falls.  Skin: Positive for rash.  Neurological: Positive for weakness. Negative for dizziness, loss of consciousness and headaches.  Psychiatric/Behavioral: Negative for memory loss.    Past Medical History  Diagnosis Date  . Contracture of lower leg joint   . Pain in joint, pelvic region and thigh   . Disturbance of skin sensation   . Pain in joint, lower leg   . Restless legs syndrome (RLS)   . Disorder of bone and cartilage, unspecified   . Unspecified cataract     not surgically ready  . Complication of anesthesia   . PONV (postoperative nausea and vomiting)   . Thyrotoxicosis without mention of goiter or other  cause, without mention of thyrotoxic crisis or storm     tx. radioactive Iodine s/p yrs after having thyroid goiter surgery  . Arthritis     Osteoarthritis -knees, hips-"pseudo gout right knee" "curvature of spine"  . Gait instability     due to contractures and curvature to spine uses-walker to ambulate  . Hypoglycemia, unspecified   . Hypoglycemia     Past Surgical History  Procedure Laterality Date  . Tonsillectomy  1932  . Goiter resection  1958  . Cholecystectomy    . Total hip arthroplasty Left 11/28/2012    Procedure: LEFT TOTAL HIP ARTHROPLASTY ANTERIOR APPROACH;  Surgeon: Mcarthur Rossetti, MD;  Location:  [facility];  Service: Orthopedics;  Laterality: Left;    Allergies  Allergen Reactions  . Aleve [Naproxen Sodium]     Stomach bleeding  . Aspirin     Stomach bleeding  . Darvocet [Propoxyphene N-Acetaminophen] Nausea And Vomiting  . Darvon [Propoxyphene Hcl] Nausea And Vomiting  . Lomotil [Diphenoxylate] Nausea And Vomiting  . Ultram [Tramadol] Nausea And Vomiting      Medication List       This list is accurate as of: 04/18/15  3:13 PM.  Always use your most recent med list.               acetaminophen 650 MG CR tablet  Commonly known as:  TYLENOL  Take 650 mg by mouth 2 (two) times daily. Take 2 tablets  (650 mg) in the evening for pain     diclofenac sodium 1 % Gel  Commonly known as:  VOLTAREN  Apply 4 g topically 4 (four) times daily. For right knee and hip arthritis     multivitamin tablet  Take 1 tablet by mouth daily.        Health Maintenance  Topic Date Due  . TETANUS/TDAP  01/16/1944  . PNA vac Low Risk Adult (2 of 2 - PPSV23) 01/22/2015  . INFLUENZA VACCINE  02/07/2015  . DEXA SCAN  Completed  . ZOSTAVAX  Completed    Physical Exam: Filed Vitals:   04/18/15 1503  BP: 158/100  Pulse: 82  Temp: 97.9 F (36.6 C)  TempSrc: Oral  Resp: 20  Height: 5\' 5"  (1.651 m)  Weight: 122 lb 12.8 oz (55.702 kg)  SpO2: 96%   Body mass index is 20.44 kg/(m^2). Physical Exam  Constitutional: She is oriented to person, place, and time.  Frail white female gets around slowly with rollator walker  Cardiovascular: Normal rate, regular rhythm, normal heart sounds and intact distal pulses.   Pulmonary/Chest: Effort normal and breath sounds normal. No respiratory distress.  Abdominal: Soft. Bowel sounds are normal. She exhibits no distension. There is no tenderness.  Musculoskeletal:  Walks slowly with kyphotic posture and scoliosis, absence of costoiliac space especially on right; tenderness of right hip and knee  Neurological: She is alert and oriented to person,  place, and time.  Skin: Skin is warm and dry. There is pallor.  Psychiatric: She has a normal mood and affect.    Labs reviewed: Basic Metabolic Panel:  Recent Labs  09/01/14 1104 04/13/15 1056  NA 142 141  K 4.6 4.5  CL 103 104  CO2 22 22  GLUCOSE 84 86  BUN 27 23  CREATININE 0.57 0.52*  CALCIUM 9.8 9.5  TSH 2.210  --    Liver Function Tests:  Recent Labs  09/01/14 1104 04/13/15 1056  AST 17 18  ALT 11 12  ALKPHOS 58 53  BILITOT 0.6 0.5  PROT 6.6 6.5  ALBUMIN 4.3 4.3   No results for input(s): LIPASE, AMYLASE in the last 8760 hours. No results for input(s): AMMONIA in the last 8760 hours. CBC:  Recent Labs  09/01/14 1104 04/13/15 1056  WBC 4.5 4.5  NEUTROABS 2.2 2.4  HGB 13.2  --   HCT 38.5 37.8  MCV 96  --    Lipid Panel: No results for input(s): CHOL, HDL, LDLCALC, TRIG, CHOLHDL, LDLDIRECT in the last 8760 hours. No results found for: HGBA1C  Assessment/Plan 1. Primary osteoarthritis of right hip - will try her on butrans patch for her pain--hoping she'll have no nausea or constipation from this as compared to tramadol in the past -cannot take many meds as in hpi/allergies - buprenorphine (BUTRANS - DOSED MCG/HR) 5 MCG/HR PTWK patch; Place 1 patch (5 mcg total) onto the skin once a week.  Dispense: 4 patch; Refill: 3 - cont diclofenac sodium (VOLTAREN) 1 % GEL; Apply 4 g topically 4 (four) times daily. For right knee and hip arthritis  Dispense: 200 g; Refill: 3--pt uses only at bedtime, but is trying now in daytime, as well  2. Pseudogout of right knee - start butrans patch, cont voltaren - diclofenac sodium (VOLTAREN) 1 % GEL; Apply 4 g topically 4 (four) times daily. For right knee and hip arthritis  Dispense: 200 g; Refill: 3  3. Hypoglycemia - longstanding history; not diabetic -cont to eat regular meals and she's had to had a bit of sugar to her cereal in the mornings - Basic metabolic panel; Future - CBC with Differential/Platelet;  Future  4. Seborrheic keratoses - left thigh--monitor  5. Gait instability -cont use of rollator walker for stability  6. Need for prophylactic vaccination and inoculation against influenza - Flu Vaccine QUAD 36+ mos PF IM (Fluarix & Fluzone Quad PF) was given   Labs/tests ordered:   Orders Placed This Encounter  Procedures  . Flu Vaccine QUAD 36+ mos PF IM (Fluarix & Fluzone Quad PF)  . Basic metabolic panel    Standing Status: Future     Number of Occurrences:      Standing Expiration Date: 04/17/2016  . CBC with Differential/Platelet    Standing Status: Future     Number of Occurrences:      Standing Expiration Date: 04/17/2016    Next appt:  6 mos for med mgt Vedant Shehadeh L. Hadley Soileau, D.O. [facility] [address], [place] [address] Cell Phone (Mon-Fri 8am-5pm):  [phone] On Call:  [phone] & follow prompts after 5pm & weekends Office Phone:  [phone] Office Fax:  [phone]

## 2015-05-30 ENCOUNTER — Telehealth: Payer: Self-pay

## 2015-05-30 NOTE — Telephone Encounter (Signed)
Son Jeneen Rinks, called, wants Rx for folding wheelchair, to use outside. Call [phone] to pick Rx, Jeneen Rinks

## 2015-06-01 ENCOUNTER — Telehealth: Payer: Self-pay

## 2015-06-01 NOTE — Telephone Encounter (Signed)
Patient called to check on the progress of her request for a wheel chair letter or script for one, I found that the script had already been written and signed by Dr. Eulas Post I informed her that her son could come and pick up the script today and it will be up front waiting for him.

## 2015-06-01 NOTE — Telephone Encounter (Signed)
[place], DO  Eilene Ghazi, RMA           Ok to do prescription for lightweight folding transport wheelchair due to her hip osteoarthritis. Ask someone who is there to sign it so that he can pick it up today. He probably wants it for the weekend. This is the first I heard of it.      Prescription was given to Dr. Eulas Post to sign for Dr. Mariea Clonts.

## 2015-08-22 ENCOUNTER — Other Ambulatory Visit: Payer: Self-pay

## 2015-08-22 DIAGNOSIS — M1611 Unilateral primary osteoarthritis, right hip: Secondary | ICD-10-CM

## 2015-08-22 DIAGNOSIS — M11261 Other chondrocalcinosis, right knee: Secondary | ICD-10-CM

## 2015-08-22 MED ORDER — DICLOFENAC SODIUM 1 % TD GEL: 4.0000 g | Freq: Four times a day (QID) | TRANSDERMAL | Status: DC

## 2015-10-11 DIAGNOSIS — H6123 Impacted cerumen, bilateral: Secondary | ICD-10-CM | POA: Diagnosis not present

## 2015-10-17 ENCOUNTER — Other Ambulatory Visit: Payer: Medicare Other

## 2015-10-17 DIAGNOSIS — E162 Hypoglycemia, unspecified: Secondary | ICD-10-CM | POA: Diagnosis not present

## 2015-10-18 LAB — CBC WITH DIFFERENTIAL/PLATELET
Basophils Absolute: 0 10*3/uL (ref 0.0–0.2)
Basos: 0 %
EOS (ABSOLUTE): 0.3 10*3/uL (ref 0.0–0.4)
Eos: 5 %
Hematocrit: 38.3 % (ref 34.0–46.6)
Hemoglobin: 12.9 g/dL (ref 11.1–15.9)
Immature Grans (Abs): 0 10*3/uL (ref 0.0–0.1)
Immature Granulocytes: 0 %
Lymphocytes Absolute: 1.4 10*3/uL (ref 0.7–3.1)
Lymphs: 30 %
MCH: 32.5 pg (ref 26.6–33.0)
MCHC: 33.7 g/dL (ref 31.5–35.7)
MCV: 97 fL (ref 79–97)
Monocytes Absolute: 0.5 10*3/uL (ref 0.1–0.9)
Monocytes: 11 %
Neutrophils Absolute: 2.5 10*3/uL (ref 1.4–7.0)
Neutrophils: 54 %
Platelets: 261 10*3/uL (ref 150–379)
RBC: 3.97 x10E6/uL (ref 3.77–5.28)
RDW: 13.6 % (ref 12.3–15.4)
WBC: 4.6 10*3/uL (ref 3.4–10.8)

## 2015-10-18 LAB — BASIC METABOLIC PANEL
BUN/Creatinine Ratio: 54 — ABNORMAL HIGH (ref 12–28)
BUN: 29 mg/dL (ref 10–36)
CO2: 22 mmol/L (ref 18–29)
Calcium: 9.6 mg/dL (ref 8.7–10.3)
Chloride: 104 mmol/L (ref 96–106)
Creatinine, Ser: 0.54 mg/dL — ABNORMAL LOW (ref 0.57–1.00)
GFR calc Af Amer: 96 mL/min/{1.73_m2} (ref >59)
GFR calc non Af Amer: 83 mL/min/{1.73_m2} (ref >59)
Glucose: 117 mg/dL — ABNORMAL HIGH (ref 65–99)
Potassium: 4.5 mmol/L (ref 3.5–5.2)
Sodium: 141 mmol/L (ref 134–144)

## 2015-10-20 ENCOUNTER — Ambulatory Visit (INDEPENDENT_AMBULATORY_CARE_PROVIDER_SITE_OTHER): Payer: Medicare Other | Admitting: Internal Medicine

## 2015-10-20 ENCOUNTER — Encounter: Payer: Self-pay | Admitting: Internal Medicine

## 2015-10-20 VITALS — BP 150/60 | HR 71 | Temp 98.0°F | Ht 64.0 in | Wt 122.0 lb

## 2015-10-20 DIAGNOSIS — M11861 Other specified crystal arthropathies, right knee: Secondary | ICD-10-CM

## 2015-10-20 DIAGNOSIS — M11261 Other chondrocalcinosis, right knee: Secondary | ICD-10-CM

## 2015-10-20 DIAGNOSIS — M1611 Unilateral primary osteoarthritis, right hip: Secondary | ICD-10-CM

## 2015-10-20 DIAGNOSIS — M6284 Sarcopenia: Secondary | ICD-10-CM | POA: Diagnosis not present

## 2015-10-20 DIAGNOSIS — L309 Dermatitis, unspecified: Secondary | ICD-10-CM

## 2015-10-20 MED ORDER — TRIAMCINOLONE ACETONIDE 0.025 % EX OINT: 1.0000 "application " | TOPICAL_OINTMENT | Freq: Every day | CUTANEOUS | Status: DC

## 2015-10-20 NOTE — Progress Notes (Signed)
Patient ID: Stacey Clay, female   DOB: 1922/08/14, 82 y.o.   MRN: [id number]   Location:  [facility] Provider:  Shanekqua Schaper L. Mariea Clonts, D.O., C.M.D.  Code Status: DNR Goals of Care:  Advanced Directives 10/20/2015  Does patient have an advance directive? Yes  Type of Advance Directive Out of facility DNR (pink MOST or yellow form)  Does patient want to make changes to advanced directive? -  Copy of advanced directive(s) in chart? Yes  Pre-existing out of facility DNR order (yellow form or pink MOST form) Yellow form placed in chart (order not valid for inpatient use);Pink MOST form placed in chart (order not valid for inpatient use)  MOST form review due at her annual  Chief Complaint  Patient presents with  . Medical Management of Chronic Issues    18mth follow-up    HPI: Patient is a 82 y.o. female seen today for medical management of chronic diseases.    She feels fairly good.    In crease between top of leg and her buttocks, felt she was getting an episode of dermatitis.  She was very mad and frustrated when she saw it 2 mos ago.  Keeps coming up in little spots.  She decided to scratch it off.  Was putting gold bond rough and bumpy on there on her left side.  That had helped.  Thinks that the area she scratched on her right posterior thigh.  Feels bumpy.  Is not painful except when she is getting ready for bed and getting undressed, the edge of her panties will rub against.  Had been putting desitin on it for a whole month.  For the last week, she switched to curel lotion and she thinks it is getting a little better now.  Wonders if she needs a dressing on it.    She has decided she absolutely does not want to take butrans patch.  When hurried, one morning she actually can with the walker.    Loves the diclofenac for her hip and knee.  She can get a good night's sleep with it.  Only uses at bedtime.  Also takes her tylenol 650mg  po bid.    Discussed increasing protein in her diet due to  low creatinine.  Has decreased muscle mass.    Discussed that she no longer needs antibiotic prophylaxis for dental work since her hip replacement was 3 years ago!  She has had both pneumonia vaccines so no further ones are needed.  It is hard for her to get out with her mobility issues so she has not had boostrix at the pharmacy.   Past Medical History  Diagnosis Date  . Contracture of lower leg joint   . Pain in joint, pelvic region and thigh   . Disturbance of skin sensation   . Pain in joint, lower leg   . Restless legs syndrome (RLS)   . Disorder of bone and cartilage, unspecified   . Unspecified cataract     not surgically ready  . Complication of anesthesia   . PONV (postoperative nausea and vomiting)   . Thyrotoxicosis without mention of goiter or other cause, without mention of thyrotoxic crisis or storm     tx. radioactive Iodine s/p yrs after having thyroid goiter surgery  . Arthritis     Osteoarthritis -knees, hips-"pseudo gout right knee" "curvature of spine"  . Gait instability     due to contractures and curvature to spine uses-walker to ambulate  . Hypoglycemia, unspecified   .  Hypoglycemia     Past Surgical History  Procedure Laterality Date  . Tonsillectomy  1932  . Goiter resection  1958  . Cholecystectomy    . Total hip arthroplasty Left 11/28/2012    Procedure: LEFT TOTAL HIP ARTHROPLASTY ANTERIOR APPROACH;  Surgeon: Mcarthur Rossetti, MD;  Location: [facility];  Service: Orthopedics;  Laterality: Left;    Allergies  Allergen Reactions  . Aleve [Naproxen Sodium]     Stomach bleeding  . Aspirin     Stomach bleeding  . Darvocet [Propoxyphene N-Acetaminophen] Nausea And Vomiting  . Darvon [Propoxyphene Hcl] Nausea And Vomiting  . Lomotil [Diphenoxylate] Nausea And Vomiting  . Ultram [Tramadol] Nausea And Vomiting      Medication List       This list is accurate as of: 10/20/15  1:44 PM.  Always use your most recent med list.                acetaminophen 650 MG CR tablet  Commonly known as:  TYLENOL  Take 650 mg by mouth 2 (two) times daily. Take 2 tablets  (650 mg) in the evening for pain     diclofenac sodium 1 % Gel  Commonly known as:  VOLTAREN  Apply 4 g topically 4 (four) times daily. For right knee and hip arthritis     multivitamin tablet  Take 1 tablet by mouth daily.        Review of Systems:  Review of Systems  Constitutional: Negative for fever and chills.  HENT: Negative for congestion.   Eyes: Negative for blurred vision.  Respiratory: Negative for shortness of breath.   Cardiovascular: Negative for chest pain and leg swelling.  Gastrointestinal: Negative for abdominal pain, constipation, blood in stool and melena.  Genitourinary: Negative for dysuria, urgency and frequency.  Musculoskeletal: Positive for joint pain. Negative for falls.  Skin: Positive for itching and rash.       Dry skin patches left side, right buttock, left distal inner thigh  Neurological: Negative for dizziness, loss of consciousness and headaches.  Psychiatric/Behavioral: Negative for depression and memory loss.    Health Maintenance  Topic Date Due  . TETANUS/TDAP  01/16/1944  . PNA vac Low Risk Adult (2 of 2 - PPSV23) 01/22/2015  . INFLUENZA VACCINE  02/07/2016  . DEXA SCAN  Completed  . ZOSTAVAX  Completed    Physical Exam: Filed Vitals:   10/20/15 1329  BP: 150/60  Pulse: 71  Temp: 98 F (36.7 C)  TempSrc: Oral  Height: 5\' 4"  (1.626 m)  Weight: 122 lb (55.339 kg)  SpO2: 97%   Body mass index is 20.93 kg/(m^2). Physical Exam  Constitutional: She is oriented to person, place, and time.  Frail white female seated on her rollator walker  HENT:  Head: Normocephalic and atraumatic.  Eyes:  glasses  Neck: Neck supple. No JVD present.  Cardiovascular: Normal rate, regular rhythm and normal heart sounds.   Pulmonary/Chest: Effort normal and breath sounds normal. No respiratory distress. She has no wheezes. She  has no rales.  Abdominal: Soft. Bowel sounds are normal.  Musculoskeletal:  Ambulates slowly with rollator walker, but son pushes her back and forth from exam room when she comes in; severe scoliosis, right abdominal distension and chronic right hip and right knee pain  Lymphadenopathy:    She has no cervical adenopathy.  Neurological: She is alert and oriented to person, place, and time.  Skin: Skin is warm and dry.  Scaly dry skin patches left  side, left distal inner thigh and right proximal thigh at gluteal crease (about 2x2 in area with one pinpoint area was skin had been pulled off), no seborrheic keratosis at site, but numerous others on chest, back, abdomen, thighs  Psychiatric: She has a normal mood and affect.    Labs reviewed: Basic Metabolic Panel:  Recent Labs  04/13/15 1056 10/17/15 1124  NA 141 141  K 4.5 4.5  CL 104 104  CO2 22 22  GLUCOSE 86 117*  BUN 23 29  CREATININE 0.52* 0.54*  CALCIUM 9.5 9.6   Liver Function Tests:  Recent Labs  04/13/15 1056  AST 18  ALT 12  ALKPHOS 53  BILITOT 0.5  PROT 6.5  ALBUMIN 4.3   No results for input(s): LIPASE, AMYLASE in the last 8760 hours. No results for input(s): AMMONIA in the last 8760 hours. CBC:  Recent Labs  04/13/15 1056 10/17/15 1124  WBC 4.5 4.6  NEUTROABS 2.4 2.5  HCT 37.8 38.3  MCV 96 97  PLT 267 261   Assessment/Plan 1. Eczema - cause of dry skin patches - has tried multiple otc agents above so Rx sent in for - triamcinolone (KENALOG) 0.025 % ointment; Apply 1 application topically at bedtime.  Dispense: 80 g; Refill: 3  2. Primary osteoarthritis of right hip -cont with tylenol and voltaren gel with benefit -refuses any opiates whatsoever  -mobility is minimal with rollator walker, but she does try to walk in her apt  3. Pseudogout of right knee -also painful and uses tylenol and voltaren with benefit, has good and bad days  4. Sarcopenia -discussed that her low creatinine is likely  due to this as she becomes increasingly frail with age -she will increase the protein in her diet  Labs/tests ordered: No orders of the defined types were placed in this encounter.    Next appt:  6 mos for her annual exam, no labs b/c she just had them this time  Yitta Gongaware L. Diyan Dave, D.O. [facility] [address], [place] [address] Cell Phone (Mon-Fri 8am-5pm):  [phone] On Call:  [phone] & follow prompts after 5pm & weekends Office Phone:  [phone] Office Fax:  [phone]

## 2015-10-20 NOTE — Patient Instructions (Signed)
Please complete your living will.   We will review your MOST next time.

## 2015-11-17 ENCOUNTER — Telehealth: Payer: Self-pay | Admitting: *Deleted

## 2015-11-17 NOTE — Telephone Encounter (Signed)
I suggest she cover the area with a bandaid (she may need someone to help her do that).

## 2015-11-17 NOTE — Telephone Encounter (Signed)
Patient notified and agreed.  

## 2015-11-17 NOTE — Telephone Encounter (Signed)
Patient called and stated that she was given an ointment for her wound and she has been using it for about a month now and have noticed some slight bleeding from the wound at the top of Right leg next to her buttock. She thinks the bleeding is coming from coming in contact with her clothing. Please Advise.

## 2015-12-12 ENCOUNTER — Telehealth: Payer: Self-pay

## 2015-12-12 ENCOUNTER — Other Ambulatory Visit: Payer: Self-pay | Admitting: *Deleted

## 2015-12-12 DIAGNOSIS — M1611 Unilateral primary osteoarthritis, right hip: Secondary | ICD-10-CM

## 2015-12-12 DIAGNOSIS — M11261 Other chondrocalcinosis, right knee: Secondary | ICD-10-CM

## 2015-12-12 MED ORDER — DICLOFENAC SODIUM 1 % TD GEL: 4.0000 g | Freq: Four times a day (QID) | TRANSDERMAL | Status: DC

## 2015-12-12 NOTE — Telephone Encounter (Signed)
[facility] 

## 2015-12-12 NOTE — Telephone Encounter (Signed)
Prior authorization was received for Diclofenac Sodium 1% gel. PA was initiated via [email or web address]; keyword [id number]   Awaiting determination.

## 2015-12-13 ENCOUNTER — Telehealth: Payer: Self-pay

## 2015-12-13 NOTE — Telephone Encounter (Signed)
Approval letter received from [employer]. Diclofenac Sodium 1 %, approved through 07/08/16.  Approval letter faxed to [facility]

## 2016-02-24 DIAGNOSIS — D485 Neoplasm of uncertain behavior of skin: Secondary | ICD-10-CM | POA: Diagnosis not present

## 2016-02-24 DIAGNOSIS — L821 Other seborrheic keratosis: Secondary | ICD-10-CM | POA: Diagnosis not present

## 2016-02-24 DIAGNOSIS — Z85828 Personal history of other malignant neoplasm of skin: Secondary | ICD-10-CM | POA: Diagnosis not present

## 2016-02-24 DIAGNOSIS — L98411 Non-pressure chronic ulcer of buttock limited to breakdown of skin: Secondary | ICD-10-CM | POA: Diagnosis not present

## 2016-02-24 DIAGNOSIS — D216 Benign neoplasm of connective and other soft tissue of trunk, unspecified: Secondary | ICD-10-CM | POA: Diagnosis not present

## 2016-04-03 ENCOUNTER — Other Ambulatory Visit: Payer: Self-pay | Admitting: Internal Medicine

## 2016-04-03 DIAGNOSIS — M11261 Other chondrocalcinosis, right knee: Secondary | ICD-10-CM

## 2016-04-03 DIAGNOSIS — M1611 Unilateral primary osteoarthritis, right hip: Secondary | ICD-10-CM

## 2016-04-05 ENCOUNTER — Telehealth: Payer: Self-pay | Admitting: Internal Medicine

## 2016-04-05 NOTE — Telephone Encounter (Signed)
I spoke with Stacey Clay and advised her to come at 12:45pm on 10/16 for Medicare AWV, then see her doctor at 1:45. VDM

## 2016-04-06 ENCOUNTER — Other Ambulatory Visit: Payer: Self-pay

## 2016-04-06 DIAGNOSIS — Z23 Encounter for immunization: Secondary | ICD-10-CM | POA: Diagnosis not present

## 2016-04-06 DIAGNOSIS — D508 Other iron deficiency anemias: Secondary | ICD-10-CM

## 2016-04-06 DIAGNOSIS — M1990 Unspecified osteoarthritis, unspecified site: Secondary | ICD-10-CM

## 2016-04-09 DIAGNOSIS — H6121 Impacted cerumen, right ear: Secondary | ICD-10-CM | POA: Diagnosis not present

## 2016-04-16 ENCOUNTER — Other Ambulatory Visit: Payer: Medicare Other

## 2016-04-16 DIAGNOSIS — D508 Other iron deficiency anemias: Secondary | ICD-10-CM | POA: Diagnosis not present

## 2016-04-16 DIAGNOSIS — M1990 Unspecified osteoarthritis, unspecified site: Secondary | ICD-10-CM | POA: Diagnosis not present

## 2016-04-16 LAB — CBC WITH DIFFERENTIAL/PLATELET
Basophils Absolute: 0 cells/uL (ref 0–200)
Basophils Relative: 0 %
Eosinophils Absolute: 188 cells/uL (ref 15–500)
Eosinophils Relative: 4 %
HCT: 38.8 % (ref 35.0–45.0)
Hemoglobin: 13 g/dL (ref 11.7–15.5)
Lymphocytes Relative: 31 %
Lymphs Abs: 1457 cells/uL (ref 850–3900)
MCH: 32.9 pg (ref 27.0–33.0)
MCHC: 33.5 g/dL (ref 32.0–36.0)
MCV: 98.2 fL (ref 80.0–100.0)
MPV: 9.1 fL (ref 7.5–12.5)
Monocytes Absolute: 564 cells/uL (ref 200–950)
Monocytes Relative: 12 %
Neutro Abs: 2491 cells/uL (ref 1500–7800)
Neutrophils Relative %: 53 %
Platelets: 255 10*3/uL (ref 140–400)
RBC: 3.95 MIL/uL (ref 3.80–5.10)
RDW: 13 % (ref 11.0–15.0)
WBC: 4.7 10*3/uL (ref 3.8–10.8)

## 2016-04-16 LAB — BASIC METABOLIC PANEL
BUN: 25 mg/dL (ref 7–25)
CO2: 26 mmol/L (ref 20–31)
Calcium: 9.4 mg/dL (ref 8.6–10.4)
Chloride: 106 mmol/L (ref 98–110)
Creat: 0.66 mg/dL (ref 0.60–0.88)
Glucose, Bld: 91 mg/dL (ref 65–99)
Potassium: 4.5 mmol/L (ref 3.5–5.3)
Sodium: 140 mmol/L (ref 135–146)

## 2016-04-19 ENCOUNTER — Other Ambulatory Visit: Payer: Self-pay

## 2016-04-19 ENCOUNTER — Encounter: Payer: Self-pay | Admitting: *Deleted

## 2016-04-20 ENCOUNTER — Other Ambulatory Visit: Payer: Self-pay

## 2016-04-23 ENCOUNTER — Ambulatory Visit (INDEPENDENT_AMBULATORY_CARE_PROVIDER_SITE_OTHER): Payer: Medicare Other

## 2016-04-23 ENCOUNTER — Encounter: Payer: Self-pay | Admitting: Internal Medicine

## 2016-04-23 ENCOUNTER — Ambulatory Visit (INDEPENDENT_AMBULATORY_CARE_PROVIDER_SITE_OTHER): Payer: Medicare Other | Admitting: Internal Medicine

## 2016-04-23 ENCOUNTER — Ambulatory Visit: Payer: Medicare Other

## 2016-04-23 VITALS — BP 112/70 | HR 79 | Temp 97.6°F | Ht 62.0 in | Wt 127.0 lb

## 2016-04-23 VITALS — BP 112/70 | HR 79 | Temp 97.6°F | Ht 62.0 in | Wt 127.2 lb

## 2016-04-23 DIAGNOSIS — M6284 Sarcopenia: Secondary | ICD-10-CM | POA: Diagnosis not present

## 2016-04-23 DIAGNOSIS — L821 Other seborrheic keratosis: Secondary | ICD-10-CM

## 2016-04-23 DIAGNOSIS — M11261 Other chondrocalcinosis, right knee: Secondary | ICD-10-CM

## 2016-04-23 DIAGNOSIS — M1611 Unilateral primary osteoarthritis, right hip: Secondary | ICD-10-CM

## 2016-04-23 DIAGNOSIS — Z0000 Encounter for general adult medical examination without abnormal findings: Secondary | ICD-10-CM

## 2016-04-23 DIAGNOSIS — R2681 Unsteadiness on feet: Secondary | ICD-10-CM

## 2016-04-23 DIAGNOSIS — G47419 Narcolepsy without cataplexy: Secondary | ICD-10-CM | POA: Insufficient documentation

## 2016-04-23 DIAGNOSIS — Z23 Encounter for immunization: Secondary | ICD-10-CM | POA: Diagnosis not present

## 2016-04-23 NOTE — Progress Notes (Signed)
Subjective:   Stacey Clay is a 82 y.o. female who presents for Medicare Annual (Subsequent) preventive examination.  Review of Systems:  N/A Cardiac Risk Factors include: advanced age (>6men, >7 women)     Objective:     Vitals: BP 112/70 (BP Location: Left Arm, Patient Position: Sitting, Cuff Size: Normal)   Pulse 79   Temp 97.6 F (36.4 C) (Oral)   Ht 5\' 2"  (1.575 m)   Wt 127 lb 3 oz (57.7 kg)   SpO2 96%   BMI 23.26 kg/m   Body mass index is 23.26 kg/m.   Tobacco History  Smoking Status  . Never Smoker  Smokeless Tobacco  . Never Used     Counseling given: No   Past Medical History:  Diagnosis Date  . Arthritis    Osteoarthritis -knees, hips-"pseudo gout right knee" "curvature of spine"  . Complication of anesthesia   . Contracture of lower leg joint   . Disorder of bone and cartilage, unspecified   . Disturbance of skin sensation   . Gait instability    due to contractures and curvature to spine uses-walker to ambulate  . Hypoglycemia   . Hypoglycemia, unspecified   . Pain in joint, lower leg   . Pain in joint, pelvic region and thigh   . PONV (postoperative nausea and vomiting)   . Restless legs syndrome (RLS)   . 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 radioactive Iodine s/p yrs after having thyroid goiter surgery  . Unspecified cataract    not surgically ready   Past Surgical History:  Procedure Laterality Date  . CHOLECYSTECTOMY    . goiter resection  1958  . TONSILLECTOMY  1932  . TOTAL HIP ARTHROPLASTY Left 11/28/2012   Procedure: LEFT TOTAL HIP ARTHROPLASTY ANTERIOR APPROACH;  Surgeon: Mcarthur Rossetti, MD;  Location: [facility];  Service: Orthopedics;  Laterality: Left;   Family History  Problem Relation Age of Onset  . Stroke Mother   . Stroke Father    History  Sexual Activity  . Sexual activity: No    Outpatient Encounter Prescriptions as of 04/23/2016    Medication Sig  . acetaminophen (TYLENOL) 650 MG CR tablet Take 650 mg by mouth 2 (two) times daily. Take 2 tablets  (650 mg) in the evening for pain  . diclofenac sodium (VOLTAREN) 1 % GEL apply 4 grams to affected area four times a day TO RIGHT KNEE AND HIP ARTHRITIS  . Multiple Vitamin (MULTIVITAMIN) tablet Take 1 tablet by mouth daily.  . [DISCONTINUED] triamcinolone (KENALOG) 0.025 % ointment Apply 1 application topically at bedtime. (Patient not taking: Reported on 04/23/2016)   No facility-administered encounter medications on file as of 04/23/2016.     Activities of Daily Living In your present state of health, do you have any difficulty performing the following activities: 04/23/2016  Hearing? Y  Vision? N  Difficulty concentrating or making decisions? Y  Walking or climbing stairs? Y  Dressing or bathing? N  Doing errands, shopping? Y  Preparing Food and eating ? N  Using the Toilet? N  In the past six months, have you accidently leaked urine? Y  Do you have problems with loss of bowel control? N  Managing your Medications? N  Managing your Finances? Y  Housekeeping or managing your Housekeeping? Y  Some recent data might be hidden    Patient Care Team: Gayland Curry, DO as PCP -  General (Geriatric Medicine)    Assessment:    Exercise Activities and Dietary recommendations Current Exercise Habits: The patient does not participate in regular exercise at present, Exercise limited by: Other - see comments (Back and knees)  Goals    . Increase water intake          Starting 04/23/16, I will attempt to drink 2 more glasses of water per day.       Fall Risk Fall Risk  04/23/2016 10/20/2015 04/18/2015 09/06/2014 01/21/2014  Falls in the past year? No No Yes No No  Number falls in past yr: - - 1 - -  Injury with Fall? - - No - -  Risk for fall due to : Impaired balance/gait;History of fall(s) - - - -   Depression Screen PHQ 2/9 Scores 04/23/2016 10/20/2015  04/18/2015 09/06/2014  PHQ - 2 Score 0 0 0 0     Cognitive Testing MMSE - Mini Mental State Exam 04/23/2016  Orientation to time 5  Orientation to Place 5  Registration 3  Attention/ Calculation 5  Recall 3  Language- name 2 objects 2  Language- repeat 1  Language- follow 3 step command 3  Language- read & follow direction 1  Write a sentence 1    Immunization History  Administered Date(s) Administered  . Influenza,inj,Quad PF,36+ Mos 04/18/2015  . Influenza-Unspecified 03/30/2013, 04/23/2014, 04/06/2016  . Pneumococcal Conjugate-13 07/09/2005, 01/21/2014  . Pneumococcal Polysaccharide-23 04/23/2016  . Zoster 10/09/2013   Screening Tests Health Maintenance  Topic Date Due  . TETANUS/TDAP  01/16/1942  . PNA vac Low Risk Adult (2 of 2 - PPSV23) 01/22/2015  . INFLUENZA VACCINE  Completed  . DEXA SCAN  Completed  . ZOSTAVAX  Completed      Plan:    I have personally reviewed and addressed the Medicare Annual Wellness questionnaire and have noted the following in the patient's chart:  A. Medical and social history B. Use of alcohol, tobacco or illicit drugs  C. Current medications and supplements D. Functional ability and status E.  Nutritional status F.  Physical activity G. Advance directives H. List of other physicians I.  Hospitalizations, surgeries, and ER visits in previous 12 months J.  [place] to include hearing, vision, cognitive, depression L. Referrals and appointments - none  In addition, I have reviewed and discussed with patient certain preventive protocols, quality metrics, and best practice recommendations. A written personalized care plan for preventive services as well as general preventive health recommendations were provided to patient.  See attached scanned questionnaire for additional information.   Signed,   Allyn Kenner, LPN Health Advisor

## 2016-04-23 NOTE — Progress Notes (Signed)
Provider:  Rexene Edison. Mariea Clonts, D.O., C.M.D. Location:   [place]  Place of Service:   Clinic  Previous PCP: Hollace Kinnier, DO Patient Care Team: Gayland Curry, DO as PCP - General (Geriatric Medicine)  Extended Emergency Contact Information Primary Emergency Contact: Cavallaro,James E Address: [address]          [place] [address] [place] Phone: [phone] Mobile Phone: [phone] Relation: None  Code Status: DNR Goals of Care: Advanced Directive information Advanced Directives 04/23/2016  Does patient have an advance directive? Yes  Type of Advance Directive Out of facility DNR (pink MOST or yellow form)  Does patient want to make changes to advanced directive? -  Copy of advanced directive(s) in chart? Yes  Would patient like information on creating an advanced directive? -  Pre-existing out of facility DNR order (yellow form or pink MOST form) Pink MOST form placed in chart (order not valid for inpatient use);Yellow form placed in chart (order not valid for inpatient use)    Chief Complaint  Patient presents with  . Annual Exam    physical exam    HPI: Patient is a 82 y.o. female seen today for an annual physical exam and med mgt of chronic diseases.  She had her annual wellness visit with the LPN today.    Bone density:  Does not want b/c she does not want to   Pt could get Td.  Refuses today b/c had pneumovax today.  Will plan on it for next time.  Says she does not think she ever told me, but she's had a family history of sleep disorder and she will get very sleepy very easily like they do.  She can be finishing lunch, holding her fork or spoon and will fall asleep for just a few seconds, then wake up.  Noticed at first in college.    Continues to use her diclofenac for her right hip and it is so wonderful she says.  She uses it also on her right knee before bed.  Can then sleep well.    No change in energy levels. Routine is unchanged.  Cannot  exercise.  Alisa did advise for her to drink more water.    Past Medical History:  Diagnosis Date  . Arthritis    Osteoarthritis -knees, hips-"pseudo gout right knee" "curvature of spine"  . Complication of anesthesia   . Contracture of lower leg joint   . Disorder of bone and cartilage, unspecified   . Disturbance of skin sensation   . Gait instability    due to contractures and curvature to spine uses-walker to ambulate  . Hypoglycemia   . Hypoglycemia, unspecified   . Pain in joint, lower leg   . Pain in joint, pelvic region and thigh   . PONV (postoperative nausea and vomiting)   . Restless legs syndrome (RLS)   . 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 radioactive Iodine s/p yrs after having thyroid goiter surgery  . Unspecified cataract    not surgically ready   Past Surgical History:  Procedure Laterality Date  . CHOLECYSTECTOMY    . goiter resection  1958  . TONSILLECTOMY  1932  . TOTAL HIP ARTHROPLASTY Left 11/28/2012   Procedure: LEFT TOTAL HIP ARTHROPLASTY ANTERIOR APPROACH;  Surgeon: Mcarthur Rossetti, MD;  Location: [facility];  Service: Orthopedics;  Laterality: Left;    reports that she has never smoked.  She has never used smokeless tobacco. She reports that she does not drink alcohol or use drugs.  Functional Status Survey:    Family History  Problem Relation Age of Onset  . Stroke Mother   . Stroke Father     Health Maintenance  Topic Date Due  . TETANUS/TDAP  01/16/1942  . INFLUENZA VACCINE  Completed  . DEXA SCAN  Completed  . ZOSTAVAX  Completed  . PNA vac Low Risk Adult  Completed    Allergies  Allergen Reactions  . Aleve [Naproxen Sodium]     Stomach bleeding  . Aspirin     Stomach bleeding  . Darvocet [Propoxyphene N-Acetaminophen] Nausea And Vomiting  . Darvon [Propoxyphene Hcl] Nausea And Vomiting  . Lomotil [Diphenoxylate] Nausea And Vomiting  . Ultram [Tramadol]  Nausea And Vomiting      Medication List       Accurate as of 04/23/16  1:43 PM. Always use your most recent med list.          acetaminophen 650 MG CR tablet Commonly known as:  TYLENOL Take 650 mg by mouth 2 (two) times daily. Take 2 tablets  (650 mg) in the evening for pain   diclofenac sodium 1 % Gel Commonly known as:  VOLTAREN apply 4 grams to affected area four times a day TO RIGHT KNEE AND HIP ARTHRITIS   multivitamin tablet Take 1 tablet by mouth daily.       Review of Systems  Constitutional: Negative for chills, fever and malaise/fatigue.  HENT: Positive for hearing loss. Negative for congestion.   Eyes: Negative for blurred vision.       Glasses  Respiratory: Negative for cough and shortness of breath.   Cardiovascular: Negative for chest pain, palpitations and leg swelling.  Gastrointestinal: Negative for abdominal pain, blood in stool, constipation and melena.  Genitourinary: Positive for frequency. Negative for dysuria and urgency.  Musculoskeletal: Positive for joint pain. Negative for back pain, falls, myalgias and neck pain.  Skin: Negative for itching and rash.       Seborrheic keratoses of back and chest; area healed where she had scraped one off of her right posterior thigh  Neurological: Negative for dizziness, loss of consciousness and weakness.  Endo/Heme/Allergies: Bruises/bleeds easily.  Psychiatric/Behavioral: Negative for depression and memory loss. The patient is not nervous/anxious and does not have insomnia.        Falls asleep easily--low grade version of narcolepsy it sounds like    Vitals:   04/23/16 1309  BP: 112/70  Pulse: 79  Temp: 97.6 F (36.4 C)  TempSrc: Oral  SpO2: 96%  Weight: 127 lb (57.6 kg)  Height: 5\' 2"  (1.575 m)   Body mass index is 23.23 kg/m. Physical Exam  Constitutional: She is oriented to person, place, and time.  Frail white female seated on rollator in NAD  HENT:  Head: Normocephalic and atraumatic.    Right Ear: External ear normal.  Left Ear: External ear normal.  Nose: Nose normal.  Mouth/Throat: Oropharynx is clear and moist.  Left hearing aide in, right currently not working  Eyes: Conjunctivae and EOM are normal. Pupils are equal, round, and reactive to light.  Neck: Normal range of motion. Neck supple. No JVD present.  Cardiovascular: Normal rate, regular rhythm, normal heart sounds and intact distal pulses.   Pulmonary/Chest: Effort normal and breath sounds normal. No respiratory distress.  Abdominal: Soft. Bowel sounds are normal.  Right-sided hernia present  Musculoskeletal: Normal range of motion.  Uses  rollator walker; tenderness of right hip and knee  Neurological: She is alert and oriented to person, place, and time.  Skin: Skin is warm and dry. Capillary refill takes less than 2 seconds.  Multiple seborrheic keratoses of her chest and back  Psychiatric: She has a normal mood and affect. Her behavior is normal. Judgment and thought content normal.    Labs reviewed: Basic Metabolic Panel:  Recent Labs  10/17/15 1124 04/16/16 1123  NA 141 140  K 4.5 4.5  CL 104 106  CO2 22 26  GLUCOSE 117* 91  BUN 29 25  CREATININE 0.54* 0.66  CALCIUM 9.6 9.4   Liver Function Tests: No results for input(s): AST, ALT, ALKPHOS, BILITOT, PROT, ALBUMIN in the last 8760 hours. No results for input(s): LIPASE, AMYLASE in the last 8760 hours. No results for input(s): AMMONIA in the last 8760 hours. CBC:  Recent Labs  10/17/15 1124 04/16/16 1123  WBC 4.6 4.7  NEUTROABS 2.5 2,491  HGB  --  13.0  HCT 38.3 38.8  MCV 97 98.2  PLT 261 255   Cardiac Enzymes: No results for input(s): CKTOTAL, CKMB, CKMBINDEX, TROPONINI in the last 8760 hours. BNP: Invalid input(s): POCBNP No results found for: HGBA1C Lab Results  Component Value Date   TSH 2.210 09/01/2014   Imaging and Procedures Recently: None  Assessment/Plan 1. Narcolepsy without cataplexy -pt reports this  condition today--had not told me about it before--has not caused any falls or problems  2. Primary osteoarthritis of right hip -stable and much better with use of voltaren gel   3. Pseudogout of right knee -cont voltaren gel for this also  4. Sarcopenia - ongoing muscle loss due to aging and deconditioning, does walk a lot but primarily to the restroom - Basic metabolic panel; Future - CBC with Differential/Platelet; Future  5. Gait instability - cont use of rollator walker at all times - Basic metabolic panel; Future - CBC with Differential/Platelet; Future  6. Seborrheic keratoses -ongoing itching and just hates how they look, has not had anymore injury from tearing them off of her skin like she had last time (wound up getting better with desitin in the end)  NOTE:  REFUSES BONE DENSITY DUE TO NOT WANTING TO TAKE ANYMORE MEDICATION EVEN TO PREVENT FRACTURES.  WILL GET Td next visit.  Labs/tests ordered:   Orders Placed This Encounter  Procedures  . Basic metabolic panel    Standing Status:   Future    Standing Expiration Date:   04/23/2017  . CBC with Differential/Platelet    Standing Status:   Future    Standing Expiration Date:   04/23/2017   Aaidyn San L. Zoey Bidwell, D.O. [facility] [address], [place] [address] Cell Phone (Mon-Fri 8am-5pm):  [phone] On Call:  [phone] & follow prompts after 5pm & weekends Office Phone:  [phone] Office Fax:  [phone]

## 2016-04-23 NOTE — Patient Instructions (Signed)
Stacey Clay , Thank you for taking time to come for your Medicare Wellness Visit. I appreciate your ongoing commitment to your health goals. Please review the following plan we discussed and let me know if I can assist you in the future.   These are the goals we discussed: Goals    . Increase water intake          Starting 04/23/16, I will attempt to drink 2 more glasses of water per day.        This is a list of the screening recommended for you and due dates:  Health Maintenance  Topic Date Due  . Tetanus Vaccine  01/16/1942  . Pneumonia vaccines (2 of 2 - PPSV23) 01/22/2015  . Flu Shot  Completed  . DEXA scan (bone density measurement)  Completed  . Shingles Vaccine  Completed  Preventive Care for Adults  A healthy lifestyle and preventive care can promote health and wellness. Preventive health guidelines for adults include the following key practices.  . A routine yearly physical is a good way to check with your health care provider about your health and preventive screening. It is a chance to share any concerns and updates on your health and to receive a thorough exam.  . Visit your dentist for a routine exam and preventive care every 6 months. Brush your teeth twice a day and floss once a day. Good oral hygiene prevents tooth decay and gum disease.  . The frequency of eye exams is based on your age, health, family medical history, use  of contact lenses, and other factors. Follow your health care provider's ecommendations for frequency of eye exams.  . Eat a healthy diet. Foods like vegetables, fruits, whole grains, low-fat dairy products, and lean protein foods contain the nutrients you need without too many calories. Decrease your intake of foods high in solid fats, added sugars, and salt. Eat the right amount of calories for you. Get information about a proper diet from your health care provider, if necessary.  . Regular physical exercise is one of the most important things  you can do for your health. Most adults should get at least 150 minutes of moderate-intensity exercise (any activity that increases your heart rate and causes you to sweat) each week. In addition, most adults need muscle-strengthening exercises on 2 or more days a week.  Silver Sneakers may be a benefit available to you. To determine eligibility, you may visit the website: www.silversneakers.com or contact program at 239-852-1344 Mon-Fri between 8AM-8PM.   . Maintain a healthy weight. The body mass index (BMI) is a screening tool to identify possible weight problems. It provides an estimate of body fat based on height and weight. Your health care provider can find your BMI and can help you achieve or maintain a healthy weight.   For adults 20 years and older: ? A BMI below 18.5 is considered underweight. ? A BMI of 18.5 to 24.9 is normal. ? A BMI of 25 to 29.9 is considered overweight. ? A BMI of 30 and above is considered obese.   . Maintain normal blood lipids and cholesterol levels by exercising and minimizing your intake of saturated fat. Eat a balanced diet with plenty of fruit and vegetables. Blood tests for lipids and cholesterol should begin at age 17 and be repeated every 5 years. If your lipid or cholesterol levels are high, you are over 50, or you are at high risk for heart disease, you may need your cholesterol  levels checked more frequently. Ongoing high lipid and cholesterol levels should be treated with medicines if diet and exercise are not working.  . If you smoke, find out from your health care provider how to quit. If you do not use tobacco, please do not start.  . If you choose to drink alcohol, please do not consume more than 2 drinks per day. One drink is considered to be 12 ounces (355 mL) of beer, 5 ounces (148 mL) of wine, or 1.5 ounces (44 mL) of liquor.  . If you are 12-61 years old, ask your health care provider if you should take aspirin to prevent strokes.  . Use  sunscreen. Apply sunscreen liberally and repeatedly throughout the day. You should seek shade when your shadow is shorter than you. Protect yourself by wearing long sleeves, pants, a wide-brimmed hat, and sunglasses year round, whenever you are outdoors.  . Once a month, do a whole body skin exam, using a mirror to look at the skin on your back. Tell your health care provider of new moles, moles that have irregular borders, moles that are larger than a pencil eraser, or moles that have changed in shape or color.

## 2016-05-28 ENCOUNTER — Ambulatory Visit: Payer: Medicare Other | Admitting: Pharmacotherapy

## 2016-06-06 ENCOUNTER — Other Ambulatory Visit: Payer: Self-pay | Admitting: Internal Medicine

## 2016-06-06 DIAGNOSIS — M1611 Unilateral primary osteoarthritis, right hip: Secondary | ICD-10-CM

## 2016-06-06 DIAGNOSIS — M11261 Other chondrocalcinosis, right knee: Secondary | ICD-10-CM

## 2016-07-31 ENCOUNTER — Other Ambulatory Visit: Payer: Self-pay | Admitting: Internal Medicine

## 2016-07-31 DIAGNOSIS — M1611 Unilateral primary osteoarthritis, right hip: Secondary | ICD-10-CM

## 2016-07-31 DIAGNOSIS — M11261 Other chondrocalcinosis, right knee: Secondary | ICD-10-CM

## 2016-08-02 ENCOUNTER — Telehealth: Payer: Self-pay

## 2016-08-02 NOTE — Telephone Encounter (Signed)
Manual fax received from [facility] to initiate PA for diclofenac sodium 1 % gel. I called [phone] to initiate PA.  ID [id number]  ICD 10 codes M16.11 (osteoarthritis of right hip), M11.261 (pseudogout of right knee), and M16.10 (hip arthritis)   After holding for 10 minutes I ended call and went to the website [employer] and printed form for diclofenac (voltaren) gel 1 % , form completed to the best of knowledge and placed in review and sign folder for [name].

## 2016-08-06 NOTE — Telephone Encounter (Signed)
Received fax from [facility] [phone] and Diclofenac Gel 1% is APPROVED through 07/08/2017 Reference #: [id number]

## 2016-09-24 DIAGNOSIS — H1851 Endothelial corneal dystrophy: Secondary | ICD-10-CM | POA: Diagnosis not present

## 2016-09-24 DIAGNOSIS — H2513 Age-related nuclear cataract, bilateral: Secondary | ICD-10-CM | POA: Diagnosis not present

## 2016-09-24 DIAGNOSIS — H04123 Dry eye syndrome of bilateral lacrimal glands: Secondary | ICD-10-CM | POA: Diagnosis not present

## 2016-10-01 ENCOUNTER — Other Ambulatory Visit: Payer: Self-pay | Admitting: *Deleted

## 2016-10-01 DIAGNOSIS — M11261 Other chondrocalcinosis, right knee: Secondary | ICD-10-CM

## 2016-10-01 DIAGNOSIS — M1611 Unilateral primary osteoarthritis, right hip: Secondary | ICD-10-CM

## 2016-10-01 MED ORDER — DICLOFENAC SODIUM 1 % TD GEL: TRANSDERMAL | 4 refills | Status: DC

## 2016-10-01 NOTE — Telephone Encounter (Signed)
Patient requested refill

## 2016-10-22 ENCOUNTER — Other Ambulatory Visit: Payer: Medicare Other

## 2016-10-22 DIAGNOSIS — M6284 Sarcopenia: Secondary | ICD-10-CM

## 2016-10-22 DIAGNOSIS — R2681 Unsteadiness on feet: Secondary | ICD-10-CM | POA: Diagnosis not present

## 2016-10-22 LAB — CBC WITH DIFFERENTIAL/PLATELET
Basophils Absolute: 0 cells/uL (ref 0–200)
Basophils Relative: 0 %
Eosinophils Absolute: 280 cells/uL (ref 15–500)
Eosinophils Relative: 5 %
HCT: 40.8 % (ref 35.0–45.0)
Hemoglobin: 13.5 g/dL (ref 11.7–15.5)
Lymphocytes Relative: 24 %
Lymphs Abs: 1344 cells/uL (ref 850–3900)
MCH: 32.3 pg (ref 27.0–33.0)
MCHC: 33.1 g/dL (ref 32.0–36.0)
MCV: 97.6 fL (ref 80.0–100.0)
MPV: 9 fL (ref 7.5–12.5)
Monocytes Absolute: 560 cells/uL (ref 200–950)
Monocytes Relative: 10 %
Neutro Abs: 3416 cells/uL (ref 1500–7800)
Neutrophils Relative %: 61 %
Platelets: 267 10*3/uL (ref 140–400)
RBC: 4.18 MIL/uL (ref 3.80–5.10)
RDW: 13.3 % (ref 11.0–15.0)
WBC: 5.6 10*3/uL (ref 3.8–10.8)

## 2016-10-22 LAB — BASIC METABOLIC PANEL
BUN: 21 mg/dL (ref 7–25)
CO2: 26 mmol/L (ref 20–31)
Calcium: 9.4 mg/dL (ref 8.6–10.4)
Chloride: 107 mmol/L (ref 98–110)
Creat: 0.47 mg/dL — ABNORMAL LOW (ref 0.60–0.88)
Glucose, Bld: 89 mg/dL (ref 65–99)
Potassium: 4.3 mmol/L (ref 3.5–5.3)
Sodium: 140 mmol/L (ref 135–146)

## 2016-10-23 ENCOUNTER — Encounter: Payer: Self-pay | Admitting: *Deleted

## 2016-10-25 ENCOUNTER — Encounter: Payer: Self-pay | Admitting: Internal Medicine

## 2016-10-25 ENCOUNTER — Ambulatory Visit (INDEPENDENT_AMBULATORY_CARE_PROVIDER_SITE_OTHER): Payer: Medicare Other | Admitting: Internal Medicine

## 2016-10-25 VITALS — BP 130/70 | HR 99 | Temp 98.5°F | Wt 127.0 lb

## 2016-10-25 DIAGNOSIS — L821 Other seborrheic keratosis: Secondary | ICD-10-CM

## 2016-10-25 DIAGNOSIS — R42 Dizziness and giddiness: Secondary | ICD-10-CM

## 2016-10-25 DIAGNOSIS — E0590 Thyrotoxicosis, unspecified without thyrotoxic crisis or storm: Secondary | ICD-10-CM

## 2016-10-25 DIAGNOSIS — R040 Epistaxis: Secondary | ICD-10-CM | POA: Diagnosis not present

## 2016-10-25 DIAGNOSIS — M1611 Unilateral primary osteoarthritis, right hip: Secondary | ICD-10-CM | POA: Diagnosis not present

## 2016-10-25 DIAGNOSIS — M11261 Other chondrocalcinosis, right knee: Secondary | ICD-10-CM

## 2016-10-25 MED ORDER — MECLIZINE HCL 12.5 MG PO TABS
12.5000 mg | ORAL_TABLET | Freq: Every day | ORAL | 0 refills | Status: DC | PRN
Start: 2016-10-25 — End: 2020-02-07

## 2016-10-25 NOTE — Progress Notes (Signed)
Location:  [facility] clinic Provider:  Denessa Cavan L. Mariea Clonts, D.O., C.M.D.  Code Status: DNR Goals of Care:  Advanced Directives 10/25/2016  Does Patient Have a Medical Advance Directive? Yes  Type of Advance Directive Out of facility DNR (pink MOST or yellow form)  Does patient want to make changes to medical advance directive? -  Copy of Early in Chart? -  Would patient like information on creating a medical advance directive? -  Pre-existing out of facility DNR order (yellow form or pink MOST form) Yellow form placed in chart (order not valid for inpatient use);Pink MOST form placed in chart (order not valid for inpatient use)   Chief Complaint  Patient presents with  . Medical Management of Chronic Issues    67mth follow-up    HPI: Patient is a 83 y.o. female seen today for medical management of chronic diseases.    Continues to refuse bone density.   Years ago, she had a white thing that would pp up on her right hand/wrist.  Went to derm and he said it was precancerous--it was "zapped" and never came back.  About a month ago, it returned on the base of her right thumb.  A piece of it was loose and fell off.  A tiny piece still remains.  Advised to watch it for now.  In the morning, she has to blow her nose when sinuses drain.  Right nostril has tiny bit of blood.  Before bed, she's been putting a little desitin in there which has helped.    In feb, she had a bad vertigo attack. She has to look at something and count to 10 and it goes away.  She was backing up in the kitchen, stopped suddenly, and it suddenly came over her.  She thought she'd vomit.  She had a Rx for meclizine from 5 years ago.  It worked.  Wants fresh Rx.  Woke up in the middle of the night a few weeks ago with a burning inside her body.  It moved around her GI system.  She relaxed and it went away.  Never before and not since.  No indigestion.  Only involved her torso.  Went back to sleep after  relaxing.    Sleeping through the night with voltaren on right hip and knee.  Hernia still present on right lower quadrant.  Past Medical History:  Diagnosis Date  . Arthritis    Osteoarthritis -knees, hips-"pseudo gout right knee" "curvature of spine"  . Complication of anesthesia   . Contracture of lower leg joint   . Disorder of bone and cartilage, unspecified   . Disturbance of skin sensation   . Gait instability    due to contractures and curvature to spine uses-walker to ambulate  . Hypoglycemia   . Hypoglycemia, unspecified   . Pain in joint, lower leg   . Pain in joint, pelvic region and thigh   . PONV (postoperative nausea and vomiting)   . Restless legs syndrome (RLS)   . 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 radioactive Iodine s/p yrs after having thyroid goiter surgery  . Unspecified cataract    not surgically ready    Past Surgical History:  Procedure Laterality Date  . CHOLECYSTECTOMY    . goiter resection  1958  . TONSILLECTOMY  1932  . TOTAL HIP ARTHROPLASTY Left 11/28/2012   Procedure: LEFT TOTAL HIP ARTHROPLASTY ANTERIOR APPROACH;  Surgeon: Mcarthur Rossetti, MD;  Location: [facility];  Service: Orthopedics;  Laterality: Left;    Allergies  Allergen Reactions  . Aleve [Naproxen Sodium]     Stomach bleeding  . Aspirin     Stomach bleeding  . Darvocet [Propoxyphene N-Acetaminophen] Nausea And Vomiting  . Darvon [Propoxyphene Hcl] Nausea And Vomiting  . Lomotil [Diphenoxylate] Nausea And Vomiting  . Ultram [Tramadol] Nausea And Vomiting    Allergies as of 10/25/2016      Reactions   Aleve [naproxen Sodium]    Stomach bleeding   Aspirin    Stomach bleeding   Darvocet [propoxyphene N-acetaminophen] Nausea And Vomiting   Darvon [propoxyphene Hcl] Nausea And Vomiting   Lomotil [diphenoxylate] Nausea And Vomiting   Ultram [tramadol] Nausea And Vomiting      Medication List         Accurate as of 10/25/16 11:19 AM. Always use your most recent med list.          acetaminophen 650 MG CR tablet Commonly known as:  TYLENOL Take 650 mg by mouth daily. Take 2 tablets  (650 mg) in the evening for pain   diclofenac sodium 1 % Gel Commonly known as:  VOLTAREN Apply 2 g topically at bedtime.   multivitamin tablet Take 1 tablet by mouth daily.       Review of Systems:  Review of Systems  Constitutional: Negative for chills, fever and malaise/fatigue.  HENT:       Bleeding from right nare  Eyes: Negative for blurred vision.       Glasses  Respiratory: Negative for cough and shortness of breath.   Cardiovascular: Negative for chest pain and palpitations.  Gastrointestinal: Negative for abdominal pain, blood in stool, constipation, diarrhea and melena.  Genitourinary: Negative for dysuria.  Musculoskeletal: Positive for joint pain. Negative for falls.       Right knee and hip controlled well with voltaren  Skin: Negative for itching and rash.       Multiple stuck on papules  Neurological: Negative for dizziness, loss of consciousness and weakness.  Endo/Heme/Allergies: Does not bruise/bleed easily.  Psychiatric/Behavioral: Negative for memory loss.    Health Maintenance  Topic Date Due  . TETANUS/TDAP  01/16/1942  . INFLUENZA VACCINE  02/06/2017  . DEXA SCAN  Completed  . PNA vac Low Risk Adult  Completed    Physical Exam: Vitals:   10/25/16 1111  Weight: 127 lb (57.6 kg)   Body mass index is 23.23 kg/m. Physical Exam  Constitutional: She is oriented to person, place, and time. No distress.  sarcopenic female on rollator walker  HENT:  Right nasal septum with irritation, pink color  Cardiovascular: Normal rate, regular rhythm, normal heart sounds and intact distal pulses.   Pulmonary/Chest: Effort normal and breath sounds normal. No respiratory distress.  Abdominal: Bowel sounds are normal.  Large right sided lower quadrant hernia   Musculoskeletal:  Kyphoscoliosis, uses rollator walker, scoots around a lot thru her house  Neurological: She is alert and oriented to person, place, and time.  Skin: Skin is warm and dry.  Multiple seborrheic keratoses  Psychiatric: She has a normal mood and affect.    Labs reviewed: Basic Metabolic Panel:  Recent Labs  04/16/16 1123 10/22/16 1034  NA 140 140  K 4.5 4.3  CL 106 107  CO2 26 26  GLUCOSE 91 89  BUN 25 21  CREATININE 0.66 0.47*  CALCIUM 9.4 9.4   Liver Function Tests: No results for input(s):  AST, ALT, ALKPHOS, BILITOT, PROT, ALBUMIN in the last 8760 hours. No results for input(s): LIPASE, AMYLASE in the last 8760 hours. No results for input(s): AMMONIA in the last 8760 hours. CBC:  Recent Labs  04/16/16 1123 10/22/16 1034  WBC 4.7 5.6  NEUTROABS 2,491 3,416  HGB 13.0 13.5  HCT 38.8 40.8  MCV 98.2 97.6  PLT 255 267   Assessment/Plan 1. Vertigo - recurrent, will give fresh Rx for prn meclizine which she believes helped - meclizine (ANTIVERT) 12.5 MG tablet; Take 1 tablet (12.5 mg total) by mouth daily as needed for dizziness.  Dispense: 30 tablet; Refill: 0  2. Pseudogout of right knee -ongoing, uses voltaren at night and tylenol in the day with benefit  3. Primary osteoarthritis of right hip -ongoing, cont use of voltaren gel and tylenol  4. Epistaxis - due to irritated nasal septum, dryness, advised to use vaseline or cont the desitin in her nose which has helped, was very minor when blowing nose not outright bloody nose - CBC with Differential/Platelet; Future  5. Seborrheic keratoses -multiple, pt getting mixed up between this and eczema/dermatitis and thought an article she read had suggestions about preventing these, but it was really eczema related  6. Thyrotoxicosis without thyroid storm, unspecified thyrotoxicosis type -historical, f/u labs - COMPLETE METABOLIC PANEL WITH GFR; Future - TSH; Future  Labs/tests ordered:  Orders  Placed This Encounter  Procedures  . CBC with Differential/Platelet    Standing Status:   Future    Standing Expiration Date:   10/25/2017  . COMPLETE METABOLIC PANEL WITH GFR    Standing Status:   Future    Standing Expiration Date:   10/25/2017  . TSH    Standing Status:   Future    Standing Expiration Date:   10/25/2017    Next appt:  6 mos for annual  Casaundra Takacs L. Ane Conerly, D.O. [facility] [address], [place] [address] Cell Phone (Mon-Fri 8am-5pm):  [phone] On Call:  [phone] & follow prompts after 5pm & weekends Office Phone:  [phone] Office Fax:  [phone]

## 2017-03-22 ENCOUNTER — Other Ambulatory Visit: Payer: Self-pay | Admitting: *Deleted

## 2017-03-22 ENCOUNTER — Other Ambulatory Visit: Payer: Self-pay | Admitting: Internal Medicine

## 2017-03-22 DIAGNOSIS — M11261 Other chondrocalcinosis, right knee: Secondary | ICD-10-CM

## 2017-03-22 DIAGNOSIS — M1611 Unilateral primary osteoarthritis, right hip: Secondary | ICD-10-CM

## 2017-03-22 MED ORDER — DICLOFENAC SODIUM 1 % TD GEL: 2.0000 g | Freq: Every day | TRANSDERMAL | 0 refills | Status: DC

## 2017-04-09 DIAGNOSIS — Z85828 Personal history of other malignant neoplasm of skin: Secondary | ICD-10-CM | POA: Diagnosis not present

## 2017-04-09 DIAGNOSIS — L821 Other seborrheic keratosis: Secondary | ICD-10-CM | POA: Diagnosis not present

## 2017-04-09 DIAGNOSIS — L0103 Bullous impetigo: Secondary | ICD-10-CM | POA: Diagnosis not present

## 2017-04-09 DIAGNOSIS — L0889 Other specified local infections of the skin and subcutaneous tissue: Secondary | ICD-10-CM | POA: Diagnosis not present

## 2017-04-11 ENCOUNTER — Other Ambulatory Visit: Payer: Self-pay | Admitting: *Deleted

## 2017-04-11 MED ORDER — DICLOFENAC SODIUM 1 % TD GEL: 2.0000 g | Freq: Every day | TRANSDERMAL | 5 refills | Status: DC

## 2017-04-11 NOTE — Telephone Encounter (Signed)
Patient requested. Faxed.  

## 2017-04-15 ENCOUNTER — Telehealth: Payer: Self-pay

## 2017-04-15 NOTE — Telephone Encounter (Signed)
Patient called and insisted that the recent rx for diclofenac was called in with the wrong instructions. She stated that she has always used it 4-5 times per day for her pain.   I told patient that Dr. Mariea Clonts changed the directions on 10/23/16 to use 2 gr daily at bedtime. Patient insisted that this was incorrect. She stated that she is no longer taking tylenol for pain so she wants the directions on the diclofenac changed to say use up to 4-5 times daily.    Please advise.

## 2017-04-15 NOTE — Telephone Encounter (Signed)
That's fine to change the directions to 4x daily. ( I think we changed it to 2g b/c she was going to put it at a different place besides her hip at the time of the last appt.)

## 2017-04-16 MED ORDER — DICLOFENAC SODIUM 1 % TD GEL: 2.0000 g | Freq: Four times a day (QID) | TRANSDERMAL | 5 refills | Status: DC

## 2017-04-16 NOTE — Telephone Encounter (Signed)
Medication list updated and Rx escribed to pharmacy with correct directions.

## 2017-05-09 DIAGNOSIS — L821 Other seborrheic keratosis: Secondary | ICD-10-CM | POA: Diagnosis not present

## 2017-05-09 DIAGNOSIS — Z85828 Personal history of other malignant neoplasm of skin: Secondary | ICD-10-CM | POA: Diagnosis not present

## 2017-05-10 DIAGNOSIS — Z23 Encounter for immunization: Secondary | ICD-10-CM | POA: Diagnosis not present

## 2017-05-16 ENCOUNTER — Ambulatory Visit (INDEPENDENT_AMBULATORY_CARE_PROVIDER_SITE_OTHER): Payer: Medicare Other | Admitting: Internal Medicine

## 2017-05-16 ENCOUNTER — Encounter: Payer: Self-pay | Admitting: Internal Medicine

## 2017-05-16 VITALS — BP 130/70 | HR 93 | Temp 97.8°F | Wt 127.0 lb

## 2017-05-16 DIAGNOSIS — M159 Polyosteoarthritis, unspecified: Secondary | ICD-10-CM | POA: Diagnosis not present

## 2017-05-16 DIAGNOSIS — M11261 Other chondrocalcinosis, right knee: Secondary | ICD-10-CM

## 2017-05-16 DIAGNOSIS — R42 Dizziness and giddiness: Secondary | ICD-10-CM

## 2017-05-16 LAB — CBC WITH DIFFERENTIAL/PLATELET
Basophils Absolute: 40 cells/uL (ref 0–200)
Basophils Relative: 0.7 %
Eosinophils Absolute: 188 cells/uL (ref 15–500)
Eosinophils Relative: 3.3 %
HCT: 39.7 % (ref 35.0–45.0)
Hemoglobin: 13.1 g/dL (ref 11.7–15.5)
Lymphs Abs: 1408 cells/uL (ref 850–3900)
MCH: 31 pg (ref 27.0–33.0)
MCHC: 33 g/dL (ref 32.0–36.0)
MCV: 94.1 fL (ref 80.0–100.0)
MPV: 9.5 fL (ref 7.5–12.5)
Monocytes Relative: 10.3 %
Neutro Abs: 3477 cells/uL (ref 1500–7800)
Neutrophils Relative %: 61 %
Platelets: 278 10*3/uL (ref 140–400)
RBC: 4.22 10*6/uL (ref 3.80–5.10)
RDW: 11.8 % (ref 11.0–15.0)
Total Lymphocyte: 24.7 %
WBC mixed population: 587 cells/uL (ref 200–950)
WBC: 5.7 10*3/uL (ref 3.8–10.8)

## 2017-05-16 LAB — BASIC METABOLIC PANEL
BUN/Creatinine Ratio: 47 (calc) — ABNORMAL HIGH (ref 6–22)
BUN: 24 mg/dL (ref 7–25)
CO2: 27 mmol/L (ref 20–32)
Calcium: 9.3 mg/dL (ref 8.6–10.4)
Chloride: 106 mmol/L (ref 98–110)
Creat: 0.51 mg/dL — ABNORMAL LOW (ref 0.60–0.88)
Glucose, Bld: 78 mg/dL (ref 65–139)
Potassium: 4.6 mmol/L (ref 3.5–5.3)
Sodium: 140 mmol/L (ref 135–146)

## 2017-05-16 MED ORDER — DICLOFENAC SODIUM 1 % TD GEL: 2.0000 g | Freq: Four times a day (QID) | TRANSDERMAL | 5 refills | Status: DC

## 2017-05-16 NOTE — Progress Notes (Signed)
Location:  [facility] clinic Provider:  Jazman Reuter L. Mariea Clonts, D.O., C.M.D.  Code Status: DNR Goals of Care:  Advanced Directives 10/25/2016  Does Patient Have a Medical Advance Directive? Yes  Type of Advance Directive Out of facility DNR (pink MOST or yellow form)  Does patient want to make changes to medical advance directive? -  Copy of [place] in Chart? -  Would patient like information on creating a medical advance directive? -  Pre-existing out of facility DNR order (yellow form or pink MOST form) Yellow form placed in chart (order not valid for inpatient use);Pink MOST form placed in chart (order not valid for inpatient use)   Chief Complaint  Patient presents with  . Medical Management of Chronic Issues    2mth follow-up, discuss meds, vertigo    HPI: Patient is a 83 y.o. female seen today for medical management of chronic diseases.     Vertigo attack 10/23.  Was eating breakfast.  Got dizziness while eating. Got worse. Had some meclizine from last April, took one.  It improved.  Very bad all morning.  By afternoon, she did not recall the morning.  She thought maybe she'd had a small stroke, but had no other signs or symptoms of stroke.  She went back to bed, but when she got up, she was still having vertigo.  Ever since, it's been bothering her off and on.  Has taken meclizine several times when needed.  Feels better after a good meal.  Her son had come over later that evening and saw no signs of stroke.   Says no symptoms at this moment, but when walking through waiting room, she she still felt some vertigo (had taken the meclizine this am). No cold symptoms beyond her usual sinus drainage. Had been having vertigo at times when waking up and she'd focus 6 feet away and it would go away.    Had been on 4 baby asa back in the day, but had a GI bleed.  She may also have been on aleve per her son.    Past Medical History:  Diagnosis Date  . Arthritis    Osteoarthritis  -knees, hips-"pseudo gout right knee" "curvature of spine"  . Complication of anesthesia   . Contracture of lower leg joint   . Disorder of bone and cartilage, unspecified   . Disturbance of skin sensation   . Gait instability    due to contractures and curvature to spine uses-walker to ambulate  . Hypoglycemia   . Hypoglycemia, unspecified   . Pain in joint, lower leg   . Pain in joint, pelvic region and thigh   . PONV (postoperative nausea and vomiting)   . Restless legs syndrome (RLS)   . 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 radioactive Iodine s/p yrs after having thyroid goiter surgery  . Unspecified cataract    not surgically ready    Past Surgical History:  Procedure Laterality Date  . CHOLECYSTECTOMY    . goiter resection  1958  . TONSILLECTOMY  1932    Allergies  Allergen Reactions  . Aleve [Naproxen Sodium]     Stomach bleeding  . Aspirin     Stomach bleeding  . Darvocet [Propoxyphene N-Acetaminophen] Nausea And Vomiting  . Darvon [Propoxyphene Hcl] Nausea And Vomiting  . Lomotil [Diphenoxylate] Nausea And Vomiting  . Ultram [Tramadol] Nausea And Vomiting    Outpatient Encounter Medications as  of 05/16/2017  Medication Sig  . diclofenac sodium (VOLTAREN) 1 % GEL Apply 2 g topically 4 (four) times daily.  . meclizine (ANTIVERT) 12.5 MG tablet Take 1 tablet (12.5 mg total) by mouth daily as needed for dizziness.  . Multiple Vitamin (MULTIVITAMIN) tablet Take 1 tablet by mouth daily.  . [DISCONTINUED] acetaminophen (TYLENOL) 650 MG CR tablet Take 650 mg by mouth daily. Take 2 tablets  (650 mg) in the evening for pain   No facility-administered encounter medications on file as of 05/16/2017.     Review of Systems:  Review of Systems  Constitutional: Negative for chills, fever and malaise/fatigue.  HENT: Positive for congestion. Negative for tinnitus.   Eyes:       Glasses  Respiratory:  Negative for shortness of breath.   Cardiovascular: Negative for chest pain and palpitations.  Gastrointestinal: Negative for abdominal pain, heartburn, nausea and vomiting.       Hernia  Genitourinary: Negative for dysuria.  Musculoskeletal: Positive for joint pain. Negative for falls.  Skin: Negative for itching and rash.  Neurological: Positive for dizziness. Negative for loss of consciousness and weakness.  Psychiatric/Behavioral: Negative for depression and memory loss. The patient is not nervous/anxious and does not have insomnia.     Health Maintenance  Topic Date Due  . TETANUS/TDAP  01/16/1942  . INFLUENZA VACCINE  02/06/2017  . DEXA SCAN  Completed  . PNA vac Low Risk Adult  Completed    Physical Exam: Vitals:   05/16/17 1116  BP: 130/70  Pulse: 93  Temp: 97.8 F (36.6 C)  TempSrc: Oral  SpO2: 98%  Weight: 127 lb (57.6 kg)   Body mass index is 23.23 kg/m. Physical Exam  Constitutional: She is oriented to person, place, and time. No distress.  Frail white female, ambulates with rollator walker, but son pushes her through the office on the rollator b/c she moves very slowly  HENT:  Head: Normocephalic and atraumatic.  Eyes: EOM are normal. Pupils are equal, round, and reactive to light.  glasses  Cardiovascular: Normal rate, regular rhythm, normal heart sounds and intact distal pulses.  Pulmonary/Chest: Effort normal and breath sounds normal. No respiratory distress.  Abdominal: Soft. Bowel sounds are normal. A hernia is present.  Right lower abdomen  Musculoskeletal:  Kyphosis, chronic right hip tenderness and difficulty ambulating, uses rollator  Neurological: She is alert and oriented to person, place, and time. No cranial nerve deficit.  Skin: Skin is warm and dry. Capillary refill takes less than 2 seconds. There is pallor.  Psychiatric: She has a normal mood and affect.    Labs reviewed: Basic Metabolic Panel: Recent Labs    10/22/16 1034  NA 140    K 4.3  CL 107  CO2 26  GLUCOSE 89  BUN 21  CREATININE 0.47*  CALCIUM 9.4   Liver Function Tests: No results for input(s): AST, ALT, ALKPHOS, BILITOT, PROT, ALBUMIN in the last 8760 hours. No results for input(s): LIPASE, AMYLASE in the last 8760 hours. No results for input(s): AMMONIA in the last 8760 hours. CBC: Recent Labs    10/22/16 1034  WBC 5.6  NEUTROABS 3,416  HGB 13.5  HCT 40.8  MCV 97.6  PLT 267   Assessment/Plan 1. Vertigo -discussed potentially doing an MRI to evaluate her brain for possible previous stroke affecting her brainstem and balance center, but we also determined that at 89 with prior GI bleeding, we would not be putting her on ASA, her bp remains soft and she  would not take statins even if her cholesterol was elevated which it's not been when it was checked years back -I recommended home health PT for her and she and her son were going to think about it, but she felt like she was already getting over this episode -check labs to r/o electrolyte abnormality (though it may be resolved by this point if it was the trigger), but also could be brought on by her sinus congestion - CBC with Differential/Platelet - Basic metabolic panel  2. Pseudogout of right knee -chronic, cont use of voltaren gel which does help  3. Generalized osteoarthritis of multiple sites -especially right hip and shoulder pain, cont use of voltaren gel, may also take tylenol, but tends not to, and has had GI bleeding with nsaids orally  Labs/tests ordered:   Orders Placed This Encounter  Procedures  . CBC with Differential/Platelet  . Basic metabolic panel    Order Specific Question:   Has the patient fasted?    Answer:   Yes   Next appt:  6 mos med mgt  Jaleel Allen L. Vertie Dibbern, D.O. [facility] [address], [place] [address] Cell Phone (Mon-Fri 8am-5pm):  [phone] On Call:  [phone] & follow prompts after 5pm &  weekends Office Phone:  [phone] Office Fax:  [phone]

## 2017-06-05 DIAGNOSIS — J310 Chronic rhinitis: Secondary | ICD-10-CM | POA: Diagnosis not present

## 2017-06-05 DIAGNOSIS — H6121 Impacted cerumen, right ear: Secondary | ICD-10-CM | POA: Diagnosis not present

## 2017-07-04 ENCOUNTER — Ambulatory Visit: Payer: Self-pay

## 2017-07-23 ENCOUNTER — Other Ambulatory Visit: Payer: Self-pay | Admitting: *Deleted

## 2017-07-23 MED ORDER — DICLOFENAC SODIUM 1 % TD GEL: 2.0000 g | Freq: Four times a day (QID) | TRANSDERMAL | 3 refills | Status: DC

## 2017-07-23 NOTE — Telephone Encounter (Signed)
Patient requested refill to be faxed to [facility]. Faxed.

## 2017-07-24 ENCOUNTER — Telehealth: Payer: Self-pay | Admitting: *Deleted

## 2017-07-24 NOTE — Telephone Encounter (Signed)
Received fax from [facility] requesting Prior authorization for [employer]. Initiated through [facility] [employer]. Went into review with a 48-72 hour determination.  [id number] Member ID: [id number]

## 2017-07-24 NOTE — Telephone Encounter (Signed)
Received message through CoverMyMeds Medication APPROVED through 07/08/18 Faxed pharmacy.

## 2017-07-29 NOTE — Telephone Encounter (Signed)
Error

## 2017-08-01 ENCOUNTER — Other Ambulatory Visit: Payer: Self-pay | Admitting: Internal Medicine

## 2017-08-01 ENCOUNTER — Telehealth: Payer: Self-pay | Admitting: *Deleted

## 2017-08-01 DIAGNOSIS — M1611 Unilateral primary osteoarthritis, right hip: Secondary | ICD-10-CM

## 2017-08-01 NOTE — Telephone Encounter (Signed)
rx mailed to home address

## 2017-08-01 NOTE — Telephone Encounter (Signed)
Patient called and stated that she needs a Rx for a rollator for Medicare to help pay for it. Patient wants it mailed to her home address when ready. Please Advise.

## 2017-08-01 NOTE — Telephone Encounter (Signed)
Printed Rx.

## 2017-08-28 ENCOUNTER — Ambulatory Visit (INDEPENDENT_AMBULATORY_CARE_PROVIDER_SITE_OTHER): Payer: Medicare Other

## 2017-08-28 VITALS — BP 120/64 | HR 88 | Temp 97.7°F | Ht 62.0 in | Wt 128.0 lb

## 2017-08-28 DIAGNOSIS — Z0000 Encounter for general adult medical examination without abnormal findings: Secondary | ICD-10-CM | POA: Diagnosis not present

## 2017-08-28 MED ORDER — ZOSTER VAC RECOMB ADJUVANTED 50 MCG/0.5ML IM SUSR: 0.5000 mL | Freq: Once | INTRAMUSCULAR | 1 refills | Status: AC

## 2017-08-28 NOTE — Patient Instructions (Signed)
Stacey Clay , Thank you for taking time to come for your Medicare Wellness Visit. I appreciate your ongoing commitment to your health goals. Please review the following plan we discussed and let me know if I can assist you in the future.   Screening recommendations/referrals: Colonoscopy excluded, you are over age 82 Mammogram excluded, you are over age 41 Bone Density up to date Recommended yearly ophthalmology/optometry visit for glaucoma screening and checkup Recommended yearly dental visit for hygiene and checkup  Vaccinations: Influenza vaccine up to date, due 2019 fall season Pneumococcal vaccine up to date Tdap vaccine due, declined for now Shingles vaccine due, prescription sent to pharmacy    Advanced directives: Please bring [place] a copy of your living will and health care power of attorney once you get it notarized  Conditions/risks identified: none  Next appointment: Dr. Mariea Clonts 11/14/2017 @ 11am   Preventive Care 65 Years and Older, Female Preventive care refers to lifestyle choices and visits with your health care provider that can promote health and wellness. What does preventive care include?  A yearly physical exam. This is also called an annual well check.  Dental exams once or twice a year.  Routine eye exams. Ask your health care provider how often you should have your eyes checked.  Personal lifestyle choices, including:  Daily care of your teeth and gums.  Regular physical activity.  Eating a healthy diet.  Avoiding tobacco and drug use.  Limiting alcohol use.  Practicing safe sex.  Taking low-dose aspirin every day.  Taking vitamin and mineral supplements as recommended by your health care provider. What happens during an annual well check? The services and screenings done by your health care provider during your annual well check will depend on your age, overall health, lifestyle risk factors, and family history of disease. Counseling  Your health  care provider may ask you questions about your:  Alcohol use.  Tobacco use.  Drug use.  Emotional well-being.  Home and relationship well-being.  Sexual activity.  Eating habits.  History of falls.  Memory and ability to understand (cognition).  Work and work Statistician.  Reproductive health. Screening  You may have the following tests or measurements:  Height, weight, and BMI.  Blood pressure.  Lipid and cholesterol levels. These may be checked every 5 years, or more frequently if you are over 7 years old.  Skin check.  Lung cancer screening. You may have this screening every year starting at age 31 if you have a 30-pack-year history of smoking and currently smoke or have quit within the past 15 years.  Fecal occult blood test (FOBT) of the stool. You may have this test every year starting at age 87.  Flexible sigmoidoscopy or colonoscopy. You may have a sigmoidoscopy every 5 years or a colonoscopy every 10 years starting at age 4.  Hepatitis C blood test.  Hepatitis B blood test.  Sexually transmitted disease (STD) testing.  Diabetes screening. This is done by checking your blood sugar (glucose) after you have not eaten for a while (fasting). You may have this done every 1-3 years.  Bone density scan. This is done to screen for osteoporosis. You may have this done starting at age 27.  Mammogram. This may be done every 1-2 years. Talk to your health care provider about how often you should have regular mammograms. Talk with your health care provider about your test results, treatment options, and if necessary, the need for more tests. Vaccines  Your health care  provider may recommend certain vaccines, such as:  Influenza vaccine. This is recommended every year.  Tetanus, diphtheria, and acellular pertussis (Tdap, Td) vaccine. You may need a Td booster every 10 years.  Zoster vaccine. You may need this after age 27.  Pneumococcal 13-valent conjugate  (PCV13) vaccine. One dose is recommended after age 54.  Pneumococcal polysaccharide (PPSV23) vaccine. One dose is recommended after age 54. Talk to your health care provider about which screenings and vaccines you need and how often you need them. This information is not intended to replace advice given to you by your health care provider. Make sure you discuss any questions you have with your health care provider. Document Released: 07/22/2015 Document Revised: 03/14/2016 Document Reviewed: 04/26/2015 Elsevier Interactive Patient Education  2017 [place] Prevention in the Home Falls can cause injuries. They can happen to people of all ages. There are many things you can do to make your home safe and to help prevent falls. What can I do on the outside of my home?  Regularly fix the edges of walkways and driveways and fix any cracks.  Remove anything that might make you trip as you walk through a door, such as a raised step or threshold.  Trim any bushes or trees on the path to your home.  Use bright outdoor lighting.  Clear any walking paths of anything that might make someone trip, such as rocks or tools.  Regularly check to see if handrails are loose or broken. Make sure that both sides of any steps have handrails.  Any raised decks and porches should have guardrails on the edges.  Have any leaves, snow, or ice cleared regularly.  Use sand or salt on walking paths during winter.  Clean up any spills in your garage right away. This includes oil or grease spills. What can I do in the bathroom?  Use night lights.  Install grab bars by the toilet and in the tub and shower. Do not use towel bars as grab bars.  Use non-skid mats or decals in the tub or shower.  If you need to sit down in the shower, use a plastic, non-slip stool.  Keep the floor dry. Clean up any water that spills on the floor as soon as it happens.  Remove soap buildup in the tub or shower  regularly.  Attach bath mats securely with double-sided non-slip rug tape.  Do not have throw rugs and other things on the floor that can make you trip. What can I do in the bedroom?  Use night lights.  Make sure that you have a light by your bed that is easy to reach.  Do not use any sheets or blankets that are too big for your bed. They should not hang down onto the floor.  Have a firm chair that has side arms. You can use this for support while you get dressed.  Do not have throw rugs and other things on the floor that can make you trip. What can I do in the kitchen?  Clean up any spills right away.  Avoid walking on wet floors.  Keep items that you use a lot in easy-to-reach places.  If you need to reach something above you, use a strong step stool that has a grab bar.  Keep electrical cords out of the way.  Do not use floor polish or wax that makes floors slippery. If you must use wax, use non-skid floor wax.  Do not have throw  rugs and other things on the floor that can make you trip. What can I do with my stairs?  Do not leave any items on the stairs.  Make sure that there are handrails on both sides of the stairs and use them. Fix handrails that are broken or loose. Make sure that handrails are as long as the stairways.  Check any carpeting to make sure that it is firmly attached to the stairs. Fix any carpet that is loose or worn.  Avoid having throw rugs at the top or bottom of the stairs. If you do have throw rugs, attach them to the floor with carpet tape.  Make sure that you have a light switch at the top of the stairs and the bottom of the stairs. If you do not have them, ask someone to add them for you. What else can I do to help prevent falls?  Wear shoes that:  Do not have high heels.  Have rubber bottoms.  Are comfortable and fit you well.  Are closed at the toe. Do not wear sandals.  If you use a stepladder:  Make sure that it is fully  opened. Do not climb a closed stepladder.  Make sure that both sides of the stepladder are locked into place.  Ask someone to hold it for you, if possible.  Clearly mark and make sure that you can see:  Any grab bars or handrails.  First and last steps.  Where the edge of each step is.  Use tools that help you move around (mobility aids) if they are needed. These include:  Canes.  Walkers.  Scooters.  Crutches.  Turn on the lights when you go into a dark area. Replace any light bulbs as soon as they burn out.  Set up your furniture so you have a clear path. Avoid moving your furniture around.  If any of your floors are uneven, fix them.  If there are any pets around you, be aware of where they are.  Review your medicines with your doctor. Some medicines can make you feel dizzy. This can increase your chance of falling. Ask your doctor what other things that you can do to help prevent falls. This information is not intended to replace advice given to you by your health care provider. Make sure you discuss any questions you have with your health care provider. Document Released: 04/21/2009 Document Revised: 12/01/2015 Document Reviewed: 07/30/2014 Elsevier Interactive Patient Education  2017 [employer].

## 2017-08-28 NOTE — Progress Notes (Addendum)
Subjective:   Stacey Clay is a 84 y.o. female who presents for Medicare Annual (Subsequent) preventive examination  Last AWV-04/23/2016    Objective:     Vitals: BP 120/64 (BP Location: Left Arm, Patient Position: Sitting)   Pulse 88   Temp 97.7 F (36.5 C) (Oral)   Ht 5\' 2"  (1.575 m)   Wt 128 lb (58.1 kg)   SpO2 97%   BMI 23.41 kg/m   Body mass index is 23.41 kg/m.  Advanced Directives 08/28/2017 10/25/2016 04/23/2016 10/20/2015 04/18/2015 09/06/2014 01/21/2014  Does Patient Have a Medical Advance Directive? Yes Yes Yes Yes Yes No Patient would like information  Type of Advance Directive [place];Living will Out of facility DNR (pink MOST or yellow form) Out of facility DNR (pink MOST or yellow form) Out of facility DNR (pink MOST or yellow form) Healthcare Power of [place];Living will - [place];Living will  Does patient want to make changes to medical advance directive? No - Patient declined - - - No - Patient declined - -  Copy of [place] in Chart? No - copy requested - Yes Yes No - copy requested - -  Would patient like information on creating a medical advance directive? - - - - - Yes - Educational materials given -  Pre-existing out of facility DNR order (yellow form or pink MOST form) - Yellow form placed in chart (order not valid for inpatient use);Pink MOST form placed in chart (order not valid for inpatient use) Pink MOST form placed in chart (order not valid for inpatient use);Yellow form placed in chart (order not valid for inpatient use) Yellow form placed in chart (order not valid for inpatient use);Pink MOST form placed in chart (order not valid for inpatient use) - - -    Tobacco Social History   Tobacco Use  Smoking Status Never Smoker  Smokeless Tobacco Never Used     Counseling given: Not Answered   Clinical Intake:  Pre-visit preparation completed: No        Diabetes: No  How often do  you need to have someone help you when you read instructions, pamphlets, or other written materials from your doctor or pharmacy?: 1 - Never What is the last grade level you completed in school?: College  Interpreter Needed?: No  Information entered by :: Tyson Dense, RN  Past Medical History:  Diagnosis Date  . Arthritis    Osteoarthritis -knees, hips-"pseudo gout right knee" "curvature of spine"  . Complication of anesthesia   . Contracture of lower leg joint   . Disorder of bone and cartilage, unspecified   . Disturbance of skin sensation   . Gait instability    due to contractures and curvature to spine uses-walker to ambulate  . Hypoglycemia   . Hypoglycemia, unspecified   . Pain in joint, lower leg   . Pain in joint, pelvic region and thigh   . PONV (postoperative nausea and vomiting)   . Restless legs syndrome (RLS)   . 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 radioactive Iodine s/p yrs after having thyroid goiter surgery  . Unspecified cataract    not surgically ready   Past Surgical History:  Procedure Laterality Date  . CHOLECYSTECTOMY    . goiter resection  1958  . TONSILLECTOMY  1932  . TOTAL HIP ARTHROPLASTY Left 11/28/2012   Procedure: LEFT TOTAL HIP ARTHROPLASTY ANTERIOR  APPROACH;  Surgeon: Mcarthur Rossetti, MD;  Location: [facility];  Service: Orthopedics;  Laterality: Left;   Family History  Problem Relation Age of Onset  . Stroke Mother   . Stroke Father    Social History   Socioeconomic History  . Marital status: Widowed    Spouse name: None  . Number of children: None  . Years of education: None  . Highest education level: None  Social Needs  . Financial resource strain: Not hard at all  . Food insecurity - worry: Never true  . Food insecurity - inability: Never true  . Transportation needs - medical: No  . Transportation needs - non-medical: No  Occupational History  .  None  Tobacco Use  . Smoking status: Never Smoker  . Smokeless tobacco: Never Used  Substance and Sexual Activity  . Alcohol use: No    Alcohol/week: 0.0 oz  . Drug use: No  . Sexual activity: No  Other Topics Concern  . None  Social History Narrative   Widowed. Lives alone   No pets   A moderate amount of caffeine beverages daily   No specific diet      Occupation: Scientist, water quality    Outpatient Encounter Medications as of 08/28/2017  Medication Sig  . diclofenac sodium (VOLTAREN) 1 % GEL Apply 2 g topically 4 (four) times daily.  . meclizine (ANTIVERT) 12.5 MG tablet Take 1 tablet (12.5 mg total) by mouth daily as needed for dizziness.  . Multiple Vitamin (MULTIVITAMIN) tablet Take 1 tablet by mouth daily.  Marland Kitchen Zoster Vaccine Adjuvanted [facility]) injection Inject 0.5 mLs into the muscle once for 1 dose.  . [DISCONTINUED] Zoster Vaccine Adjuvanted [facility]) injection Inject 0.5 mLs into the muscle once.   No facility-administered encounter medications on file as of 08/28/2017.     Activities of Daily Living In your present state of health, do you have any difficulty performing the following activities: 08/28/2017  Hearing? Y  Vision? Y  Difficulty concentrating or making decisions? Y  Walking or climbing stairs? Y  Dressing or bathing? Y  Doing errands, shopping? Y  Preparing Food and eating ? N  Using the Toilet? N  In the past six months, have you accidently leaked urine? Y  Do you have problems with loss of bowel control? N  Managing your Medications? N  Managing your Finances? N  Housekeeping or managing your Housekeeping? N  Some recent data might be hidden    Patient Care Team: Gayland Curry, DO as PCP - General (Geriatric Medicine)    Assessment:   This is a routine wellness examination for Stacey Clay.  Exercise Activities and Dietary recommendations Current Exercise Habits: The patient does not participate in regular exercise at present, Exercise limited  by: orthopedic condition(s)  Goals    None      Fall Risk Fall Risk  08/28/2017 05/16/2017 10/25/2016 04/23/2016 10/20/2015  Falls in the past year? No No No No No  Number falls in past yr: - - - - -  Injury with Fall? - - - - -  Risk for fall due to : - - - Impaired balance/gait;History of fall(s) -   Is the patient's home free of loose throw rugs in walkways, pet beds, electrical cords, etc?   yes      Grab bars in the bathroom? yes      Handrails on the stairs?   yes      Adequate lighting?   yes   Depression  Screen PHQ 2/9 Scores 08/28/2017 05/16/2017 10/25/2016 04/23/2016  PHQ - 2 Score 0 0 0 0     Cognitive Function MMSE - Mini Mental State Exam 08/28/2017 04/23/2016  Orientation to time 5 5  Orientation to Place 5 5  Registration 3 3  Attention/ Calculation 5 5  Recall 3 3  Language- name 2 objects 2 2  Language- repeat 1 1  Language- follow 3 step command 3 3  Language- read & follow direction 1 1  Write a sentence 1 1  Copy design 1 -  Total score 30 -        Immunization History  Administered Date(s) Administered  . Influenza, High Dose Seasonal PF 05/10/2017  . Influenza,inj,Quad PF,6+ Mos 04/18/2015  . Influenza-Unspecified 03/30/2013, 04/23/2014, 04/06/2016  . Pneumococcal Conjugate-13 07/09/2005, 01/21/2014  . Pneumococcal Polysaccharide-23 04/23/2016  . Zoster 10/09/2013    Qualifies for Shingles Vaccine? Yes, educated and prescription sent to pharmacy  Screening Tests Health Maintenance  Topic Date Due  . TETANUS/TDAP  01/16/1942  . INFLUENZA VACCINE  Completed  . DEXA SCAN  Completed  . PNA vac Low Risk Adult  Completed    Cancer Screenings: Lung: Low Dose CT Chest recommended if Age 3-80 years, 30 pack-year currently smoking OR have quit w/in 15years. Patient does not qualify. Breast:  Up to date on Mammogram? Yes   Up to date of Bone Density/Dexa? Yes Colorectal: up to date  Additional Screenings:  Hepatitis C Screening: declined      Plan:    I have personally reviewed and addressed the Medicare Annual Wellness questionnaire and have noted the following in the patient's chart:  A. Medical and social history B. Use of alcohol, tobacco or illicit drugs  C. Current medications and supplements D. Functional ability and status E.  Nutritional status F.  Physical activity G. Advance directives H. List of other physicians I.  Hospitalizations, surgeries, and ER visits in previous 12 months J.  [place] to include hearing, vision, cognitive, depression L. Referrals and appointments - none  In addition, I have reviewed and discussed with patient certain preventive protocols, quality metrics, and best practice recommendations. A written personalized care plan for preventive services as well as general preventive health recommendations were provided to patient.  See attached scanned questionnaire for additional information.   Signed,   Tyson Dense, RN Nurse Health Advisor  Patient concerns: Continues to have eczema on back

## 2017-09-02 DIAGNOSIS — H04123 Dry eye syndrome of bilateral lacrimal glands: Secondary | ICD-10-CM | POA: Diagnosis not present

## 2017-09-02 DIAGNOSIS — H2513 Age-related nuclear cataract, bilateral: Secondary | ICD-10-CM | POA: Diagnosis not present

## 2017-09-02 DIAGNOSIS — H1851 Endothelial corneal dystrophy: Secondary | ICD-10-CM | POA: Diagnosis not present

## 2017-10-14 ENCOUNTER — Ambulatory Visit (INDEPENDENT_AMBULATORY_CARE_PROVIDER_SITE_OTHER): Payer: Medicare Other | Admitting: Nurse Practitioner

## 2017-10-14 ENCOUNTER — Encounter: Payer: Self-pay | Admitting: Nurse Practitioner

## 2017-10-14 VITALS — BP 124/68 | HR 72 | Temp 97.9°F | Ht 62.0 in | Wt 123.4 lb

## 2017-10-14 DIAGNOSIS — R200 Anesthesia of skin: Secondary | ICD-10-CM | POA: Diagnosis not present

## 2017-10-14 NOTE — Progress Notes (Signed)
Careteam: Patient Care Team: Gayland Curry, DO as PCP - General (Geriatric Medicine)  Advanced Directive information Does Patient Have a Medical Advance Directive?: Yes, Type of Advance Directive: Out of facility DNR (pink MOST or yellow form), Pre-existing out of facility DNR order (yellow form or pink MOST form): Yellow form placed in chart (order not valid for inpatient use);Pink MOST form placed in chart (order not valid for inpatient use)  Allergies  Allergen Reactions  . Aleve [Naproxen Sodium]     Stomach bleeding  . Aspirin     Stomach bleeding  . Darvocet [Propoxyphene N-Acetaminophen] Nausea And Vomiting  . Darvon [Propoxyphene Hcl] Nausea And Vomiting  . Lomotil [Diphenoxylate] Nausea And Vomiting  . Ultram [Tramadol] Nausea And Vomiting    Chief Complaint  Patient presents with  . Acute Visit    Pt is being seen due to waking up with all fingers numb this morning. Pt reports that she also feels very shaky. She is concerned that she may have had a stroke during the night because her fingers have never been numb before. No other symptoms.   . Other    Pt son in room      HPI: Patient is a 84 y.o. female seen in the office today due to numbness and tingling in hands when she woke up, lasted about 30 mins and now hands are back to normal. Reports hands feel a little cold.  The last 2 nights has had trouble sleeping all night.  No increase in pain. No neck or shoulder pain.  Was shaking this morning but has not eaten breakfast.  No slurred speech, no blurred vision. No weakness  No numbness or tingling to legs.  Able to get dressed this morning and get to the car without problems.  Review of Systems:  Review of Systems  Constitutional: Negative for chills, fever and malaise/fatigue.  Respiratory: Negative for cough and shortness of breath.   Cardiovascular: Negative for chest pain.  Musculoskeletal: Negative for falls, joint pain, myalgias and neck pain.    Neurological: Positive for tingling and sensory change. Negative for dizziness, speech change, focal weakness, seizures, loss of consciousness, weakness and headaches.       Numbness and tingling now resolved    Past Medical History:  Diagnosis Date  . Arthritis    Osteoarthritis -knees, hips-"pseudo gout right knee" "curvature of spine"  . Complication of anesthesia   . Contracture of lower leg joint   . Disorder of bone and cartilage, unspecified   . Disturbance of skin sensation   . Gait instability    due to contractures and curvature to spine uses-walker to ambulate  . Hypoglycemia   . Hypoglycemia, unspecified   . Pain in joint, lower leg   . Pain in joint, pelvic region and thigh   . PONV (postoperative nausea and vomiting)   . Restless legs syndrome (RLS)   . 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 radioactive Iodine s/p yrs after having thyroid goiter surgery  . Unspecified cataract    not surgically ready   Past Surgical History:  Procedure Laterality Date  . CHOLECYSTECTOMY    . goiter resection  1958  . TONSILLECTOMY  1932  . TOTAL HIP ARTHROPLASTY Left 11/28/2012   Procedure: LEFT TOTAL HIP ARTHROPLASTY ANTERIOR APPROACH;  Surgeon: Mcarthur Rossetti, MD;  Location: [facility];  Service: Orthopedics;  Laterality: Left;   Social  History:   reports that she has never smoked. She has never used smokeless tobacco. She reports that she does not drink alcohol or use drugs.  Family History  Problem Relation Age of Onset  . Stroke Mother   . Stroke Father     Medications: Patient's Medications  New Prescriptions   No medications on file  Previous Medications   DICLOFENAC SODIUM (VOLTAREN) 1 % GEL    Apply 2 g topically 4 (four) times daily.   MECLIZINE (ANTIVERT) 12.5 MG TABLET    Take 1 tablet (12.5 mg total) by mouth daily as needed for dizziness.   MULTIPLE VITAMIN (MULTIVITAMIN) TABLET     Take 1 tablet by mouth daily.  Modified Medications   No medications on file  Discontinued Medications   No medications on file     Physical Exam:  Vitals:   10/14/17 1104  BP: 124/68  Pulse: 72  Temp: 97.9 F (36.6 C)  TempSrc: Oral  SpO2: 98%  Weight: 123 lb 6.4 oz (56 kg)  Height: 5\' 2"  (1.575 m)   Body mass index is 22.57 kg/m.  Physical Exam  Constitutional: She is oriented to person, place, and time. She appears well-developed and well-nourished. No distress.  Frail white female, ambulates with rollator walker but son pushes her through the office on the rollator b/c she moves very slowly Ambulation at basedline  HENT:  Head: Normocephalic and atraumatic.  Eyes: Pupils are equal, round, and reactive to light. Conjunctivae and EOM are normal.  glasses  Cardiovascular: Normal rate, regular rhythm and normal heart sounds.  Pulmonary/Chest: Effort normal and breath sounds normal. No respiratory distress.  Musculoskeletal:  Kyphosis and difficulty ambulating, uses rollator  Neurological: She is alert and oriented to person, place, and time. She displays normal reflexes. No cranial nerve deficit or sensory deficit. She exhibits normal muscle tone. Coordination normal.  Skin: Skin is warm and dry. Capillary refill takes less than 2 seconds. There is pallor.  Psychiatric: She has a normal mood and affect.    Labs reviewed: Basic Metabolic Panel: Recent Labs    10/22/16 1034 05/16/17 1211  NA 140 140  K 4.3 4.6  CL 107 106  CO2 26 27  GLUCOSE 89 78  BUN 21 24  CREATININE 0.47* 0.51*  CALCIUM 9.4 9.3   Liver Function Tests: No results for input(s): AST, ALT, ALKPHOS, BILITOT, PROT, ALBUMIN in the last 8760 hours. No results for input(s): LIPASE, AMYLASE in the last 8760 hours. No results for input(s): AMMONIA in the last 8760 hours. CBC: Recent Labs    10/22/16 1034 05/16/17 1211  WBC 5.6 5.7  NEUTROABS 3,416 3,477  HGB 13.5 13.1  HCT 40.8 39.7  MCV  97.6 94.1  PLT 267 278   Lipid Panel: No results for input(s): CHOL, HDL, LDLCALC, TRIG, CHOLHDL, LDLDIRECT in the last 8760 hours. TSH: No results for input(s): TSH in the last 8760 hours. A1C: No results found for: HGBA1C   Assessment/Plan 1. Bilateral hand numbness -currently resolved. Normal sensation bilateral and grips at this time. Discussed OA to neck/cervical spine due to hx of OA in multiple other joints. Will follow up labs today, to notify if symptoms become recurrent/persistent  - CBC with Differential/Platelets - COMPLETE METABOLIC PANEL WITH GFR - TSH  Next appt: 11/14/2017 Carlos American. [place], [facility] [phone]

## 2017-10-14 NOTE — Patient Instructions (Signed)
Will get labs today Call us if symptoms come back and persist

## 2017-10-15 LAB — CBC WITH DIFFERENTIAL/PLATELET
BASOS PCT: 0.5 %
Basophils Absolute: 37 cells/uL (ref 0–200)
EOS PCT: 3.4 %
Eosinophils Absolute: 248 cells/uL (ref 15–500)
HCT: 39.4 % (ref 35.0–45.0)
Hemoglobin: 13.3 g/dL (ref 11.7–15.5)
LYMPHS ABS: 1132 {cells}/uL (ref 850–3900)
MCH: 30.7 pg (ref 27.0–33.0)
MCHC: 33.8 g/dL (ref 32.0–36.0)
MCV: 91 fL (ref 80.0–100.0)
MPV: 9.2 fL (ref 7.5–12.5)
Monocytes Relative: 7.1 %
Neutro Abs: 5366 cells/uL (ref 1500–7800)
Neutrophils Relative %: 73.5 %
PLATELETS: 385 10*3/uL (ref 140–400)
RBC: 4.33 10*6/uL (ref 3.80–5.10)
RDW: 12.1 % (ref 11.0–15.0)
TOTAL LYMPHOCYTE: 15.5 %
WBC: 7.3 10*3/uL (ref 3.8–10.8)
WBCMIX: 518 {cells}/uL (ref 200–950)

## 2017-10-15 LAB — COMPLETE METABOLIC PANEL WITH GFR
AG Ratio: 1.5 (calc) (ref 1.0–2.5)
ALT: 11 U/L (ref 6–29)
AST: 18 U/L (ref 10–35)
Albumin: 3.9 g/dL (ref 3.6–5.1)
Alkaline phosphatase (APISO): 64 U/L (ref 33–130)
BUN/Creatinine Ratio: 42 (calc) — ABNORMAL HIGH (ref 6–22)
BUN: 24 mg/dL (ref 7–25)
CALCIUM: 9.7 mg/dL (ref 8.6–10.4)
CO2: 28 mmol/L (ref 20–32)
CREATININE: 0.57 mg/dL — AB (ref 0.60–0.88)
Chloride: 107 mmol/L (ref 98–110)
GFR, EST AFRICAN AMERICAN: 93 mL/min/{1.73_m2} (ref >=60)
GFR, EST NON AFRICAN AMERICAN: 80 mL/min/{1.73_m2} (ref >=60)
GLUCOSE: 98 mg/dL (ref 65–99)
Globulin: 2.6 g/dL (calc) (ref 1.9–3.7)
Potassium: 5 mmol/L (ref 3.5–5.3)
Sodium: 141 mmol/L (ref 135–146)
TOTAL PROTEIN: 6.5 g/dL (ref 6.1–8.1)
Total Bilirubin: 0.7 mg/dL (ref 0.2–1.2)

## 2017-10-15 LAB — TSH: TSH: 1.95 mIU/L (ref 0.40–4.50)

## 2017-11-08 DIAGNOSIS — H6123 Impacted cerumen, bilateral: Secondary | ICD-10-CM | POA: Diagnosis not present

## 2017-11-14 ENCOUNTER — Ambulatory Visit (INDEPENDENT_AMBULATORY_CARE_PROVIDER_SITE_OTHER): Payer: Medicare Other | Admitting: Internal Medicine

## 2017-11-14 ENCOUNTER — Encounter: Payer: Self-pay | Admitting: Internal Medicine

## 2017-11-14 VITALS — BP 112/60 | HR 84 | Temp 97.8°F | Ht 62.0 in | Wt 125.0 lb

## 2017-11-14 DIAGNOSIS — B027 Disseminated zoster: Secondary | ICD-10-CM | POA: Diagnosis not present

## 2017-11-14 MED ORDER — PREGABALIN 50 MG PO CAPS: 50.0000 mg | ORAL_CAPSULE | Freq: Two times a day (BID) | ORAL | 0 refills | Status: DC

## 2017-11-14 MED ORDER — VALACYCLOVIR HCL 1 G PO TABS: 1000.0000 mg | ORAL_TABLET | Freq: Two times a day (BID) | ORAL | 0 refills | Status: DC

## 2017-11-14 NOTE — Patient Instructions (Signed)
Begin valacyclovir 1000mg  by mouth twice a day for 10 days--this is the antiviral medication for shingles.    Also, start lyrica 50mg  by mouth twice a day until sample is used up.  If your itching and rash persist beyond the treatment course, please call back to be seen again.    Shingles Shingles, which is also known as herpes zoster, is an infection that causes a painful skin rash and fluid-filled blisters. Shingles is not related to genital herpes, which is a sexually transmitted infection. Shingles only develops in people who:  Have had chickenpox.  Have received the chickenpox vaccine. (This is rare.)  What are the causes? Shingles is caused by varicella-zoster virus (VZV). This is the same virus that causes chickenpox. After exposure to VZV, the virus stays in the body in an inactive (dormant) state. Shingles develops if the virus reactivates. This can happen many years after the initial exposure to VZV. It is not known what causes this virus to reactivate. What increases the risk? People who have had chickenpox or received the chickenpox vaccine are at risk for shingles. Infection is more common in people who:  Are older than age 14.  Have a weakened defense (immune) system, such as those with HIV, AIDS, or cancer.  Are taking medicines that weaken the immune system, such as transplant medicines.  Are under great stress.  What are the signs or symptoms? Early symptoms of this condition include itching, tingling, and pain in an area on your skin. Pain may be described as burning, stabbing, or throbbing. A few days or weeks after symptoms start, a painful red rash appears, usually on one side of the body in a bandlike or beltlike pattern. The rash eventually turns into fluid-filled blisters that break open, scab over, and dry up in about 2-3 weeks. At any time during the infection, you may also develop:  A fever.  Chills.  A headache.  An upset stomach.  How is this  diagnosed? This condition is diagnosed with a skin exam. Sometimes, skin or fluid samples are taken from the blisters before a diagnosis is made. These samples are examined under a microscope or sent to a lab for testing. How is this treated? There is no specific cure for this condition. Your health care provider will probably prescribe medicines to help you manage pain, recover more quickly, and avoid long-term problems. Medicines may include:  Antiviral drugs.  Anti-inflammatory drugs.  Pain medicines.  If the area involved is on your face, you may be referred to a specialist, such as an eye doctor (ophthalmologist) or an ear, nose, and throat (ENT) doctor to help you avoid eye problems, chronic pain, or disability. Follow these instructions at home: Medicines  Take medicines only as directed by your health care provider.  Apply an anti-itch or numbing cream to the affected area as directed by your health care provider. Blister and Rash Care  Take a cool bath or apply cool compresses to the area of the rash or blisters as directed by your health care provider. This may help with pain and itching.  Keep your rash covered with a loose bandage (dressing). Wear loose-fitting clothing to help ease the pain of material rubbing against the rash.  Keep your rash and blisters clean with mild soap and cool water or as directed by your health care provider.  Check your rash every day for signs of infection. These include redness, swelling, and pain that lasts or increases.  Do not pick  your blisters.  Do not scratch your rash. General instructions  Rest as directed by your health care provider.  Keep all follow-up visits as directed by your health care provider. This is important.  Until your blisters scab over, your infection can cause chickenpox in people who have never had it or been vaccinated against it. To prevent this from happening, avoid contact with other people,  especially: ? Babies. ? Pregnant women. ? Children who have eczema. ? Elderly people who have transplants. ? People who have chronic illnesses, such as leukemia or AIDS. Contact a health care provider if:  Your pain is not relieved with prescribed medicines.  Your pain does not get better after the rash heals.  Your rash looks infected. Signs of infection include redness, swelling, and pain that lasts or increases. Get help right away if:  The rash is on your face or nose.  You have facial pain, pain around your eye area, or loss of feeling on one side of your face.  You have ear pain or you have ringing in your ear.  You have loss of taste.  Your condition gets worse. This information is not intended to replace advice given to you by your health care provider. Make sure you discuss any questions you have with your health care provider. Document Released: 06/25/2005 Document Revised: 02/19/2016 Document Reviewed: 05/06/2014 Elsevier Interactive Patient Education  2018 [employer].

## 2017-11-14 NOTE — Progress Notes (Signed)
Location:  [facility] clinic Provider:  Randall Colden L. Mariea Clonts, D.O., C.M.D.  Code Status: DNR, MOST Goals of Care:  Advanced Directives 11/14/2017  Does Patient Have a Medical Advance Directive? Yes  Type of Advance Directive Out of facility DNR (pink MOST or yellow form)  Does patient want to make changes to medical advance directive? No - Patient declined  Copy of [place] in Chart? -  Would patient like information on creating a medical advance directive? -  Pre-existing out of facility DNR order (yellow form or pink MOST form) Yellow form placed in chart (order not valid for inpatient use);Pink MOST form placed in chart (order not valid for inpatient use)   Chief Complaint  Patient presents with  . Medical Management of Chronic Issues    27mth follow-up    HPI: Patient is a 84 y.o. female seen today for medical management of chronic diseases.    Rash began yesterday--red spots pop up and then morph into blisters.  She had blood last drawn April 8th and she says redness appears on her left medial arm then and it didn't go away since.  Interestingly, she was being seen for tingling and burning in her fingers of both hands coming down the arms. She says that went away with repositioning herself in the bed, but rash also has appeared on both arms.   She's tried cream and hydrocortisone on it but nothing has eradicated it.  It's nonpainful but is itchy.  She's not been outside b/c she can't walk around.  She's also tried gold bond cream.    Past Medical History:  Diagnosis Date  . Arthritis    Osteoarthritis -knees, hips-"pseudo gout right knee" "curvature of spine"  . Complication of anesthesia   . Contracture of lower leg joint   . Disorder of bone and cartilage, unspecified   . Disturbance of skin sensation   . Gait instability    due to contractures and curvature to spine uses-walker to ambulate  . Hypoglycemia   . Hypoglycemia, unspecified   . Pain in joint, lower leg     . Pain in joint, pelvic region and thigh   . PONV (postoperative nausea and vomiting)   . Restless legs syndrome (RLS)   . 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 radioactive Iodine s/p yrs after having thyroid goiter surgery  . Unspecified cataract    not surgically ready    Past Surgical History:  Procedure Laterality Date  . CHOLECYSTECTOMY    . goiter resection  1958  . TONSILLECTOMY  1932  . TOTAL HIP ARTHROPLASTY Left 11/28/2012   Procedure: LEFT TOTAL HIP ARTHROPLASTY ANTERIOR APPROACH;  Surgeon: Mcarthur Rossetti, MD;  Location: [facility];  Service: Orthopedics;  Laterality: Left;    Allergies  Allergen Reactions  . Aleve [Naproxen Sodium]     Stomach bleeding  . Aspirin     Stomach bleeding  . Darvocet [Propoxyphene N-Acetaminophen] Nausea And Vomiting  . Darvon [Propoxyphene Hcl] Nausea And Vomiting  . Lomotil [Diphenoxylate] Nausea And Vomiting  . Ultram [Tramadol] Nausea And Vomiting    Outpatient Encounter Medications as of 11/14/2017  Medication Sig  . diclofenac sodium (VOLTAREN) 1 % GEL Apply 2 g topically 4 (four) times daily.  . meclizine (ANTIVERT) 12.5 MG tablet Take 1 tablet (12.5 mg total) by mouth daily as needed for dizziness.  . Multiple Vitamin (MULTIVITAMIN) tablet Take 1 tablet by  mouth daily.   No facility-administered encounter medications on file as of 11/14/2017.     Review of Systems:  Review of Systems  Constitutional: Negative for chills, fever and malaise/fatigue.  Respiratory: Negative for cough and shortness of breath.   Cardiovascular: Negative for chest pain and palpitations.  Gastrointestinal: Negative for abdominal pain, blood in stool, constipation and melena.       Right lower quadrant hernia  Genitourinary: Negative for dysuria.  Musculoskeletal: Positive for joint pain. Negative for back pain, falls, myalgias and neck pain.  Skin: Positive for itching and  rash.    Health Maintenance  Topic Date Due  . TETANUS/TDAP  01/16/1944  . INFLUENZA VACCINE  02/06/2018  . DEXA SCAN  Completed  . PNA vac Low Risk Adult  Completed    Physical Exam: Vitals:   11/14/17 1127  BP: 112/60  Pulse: 84  Temp: 97.8 F (36.6 C)  TempSrc: Oral  SpO2: 94%  Weight: 125 lb (56.7 kg)  Height: 5\' 2"  (1.575 m)   Body mass index is 22.86 kg/m. Physical Exam  Constitutional: She is oriented to person, place, and time.  Frail female seated on rollator walker  HENT:  Head: Normocephalic and atraumatic.  Eyes:  glasses  Cardiovascular: Normal rate, regular rhythm, normal heart sounds and intact distal pulses.  Pulmonary/Chest: Effort normal and breath sounds normal. No respiratory distress.  Abdominal: Soft. Bowel sounds are normal. There is no tenderness. There is no guarding. A hernia is present.  Musculoskeletal: Normal range of motion.  kyphoscoliosis  Neurological: She is alert and oriented to person, place, and time.  Skin:     Papulovesicular rash of bilateral medial arms (left worst), some on upper back and shoulders, anterior thighs, no mucosal lesions and none on rest of legs, buttocks, abdomen  Psychiatric: She has a normal mood and affect.    Labs reviewed: Basic Metabolic Panel: Recent Labs    05/16/17 1211 10/14/17 1134  NA 140 141  K 4.6 5.0  CL 106 107  CO2 27 28  GLUCOSE 78 98  BUN 24 24  CREATININE 0.51* 0.57*  CALCIUM 9.3 9.7  TSH  --  1.95   Liver Function Tests: Recent Labs    10/14/17 1134  AST 18  ALT 11  BILITOT 0.7  PROT 6.5   No results for input(s): LIPASE, AMYLASE in the last 8760 hours. No results for input(s): AMMONIA in the last 8760 hours. CBC: Recent Labs    05/16/17 1211 10/14/17 1134  WBC 5.7 7.3  NEUTROABS 3,477 5,366  HGB 13.1 13.3  HCT 39.7 39.4  MCV 94.1 91.0  PLT 278 385    Assessment/Plan 1. Disseminated herpes zoster -suspect this began about a month ago when she had the  neuropathic pain down her arms -unusual presentation with dissemination like this, but she has no exposure to suggest a poison ivy or oak and lesions are not linear -she was vaccinated also for shingles with the old vaccine (zostavax) that was that that effective -due to itching and still getting lesions, will treat like it's a new case - valACYclovir (VALTREX) 1000 MG tablet; Take 1 tablet (1,000 mg total) by mouth 2 (two) times daily.  Dispense: 20 tablet; Refill: 0 - pregabalin (LYRICA) 50 MG capsule; Take 1 capsule (50 mg total) by mouth 2 (two) times daily. For itching  Dispense: 21 capsule; Refill: 0 (samples given) -f/u 2 wks and call if problems before  Labs/tests ordered:  No orders of the  defined types were placed in this encounter.  Next appt: 12/16/2017 f/u on shingles  Arianah Torgeson L. Geary Rufo, D.O. [facility] [address], [place] [address] Cell Phone (Mon-Fri 8am-5pm):  [phone] On Call:  [phone] & follow prompts after 5pm & weekends Office Phone:  [phone] Office Fax:  [phone]

## 2017-11-26 ENCOUNTER — Other Ambulatory Visit: Payer: Self-pay | Admitting: Internal Medicine

## 2017-11-27 ENCOUNTER — Telehealth: Payer: Self-pay | Admitting: *Deleted

## 2017-11-27 DIAGNOSIS — B027 Disseminated zoster: Secondary | ICD-10-CM

## 2017-11-27 NOTE — Telephone Encounter (Signed)
Patient called requesting a refill on Valacyclovir. Stated that the shingles/blisters is spreading to her hands and in between her fingers. Wants refill called to [facility]. Please Advise.

## 2017-11-27 NOTE — Telephone Encounter (Signed)
Spoke with patient and advised results, she agreed to dermatology referral.  Referral placed

## 2017-11-27 NOTE — Telephone Encounter (Signed)
I would recommend she see dermatology.  I would have expected some improvement rather than worsening of her blisters with the antiviral treatment.  If she agrees, we can place a referral and send my last note to derm.

## 2017-11-29 ENCOUNTER — Telehealth: Payer: Self-pay

## 2017-11-29 NOTE — Telephone Encounter (Signed)
Patient called for an appointment today due to having blisters on/between her toes that just came up. There are no available appointments in the office today. Pt will go to urgent care.

## 2017-11-29 NOTE — Telephone Encounter (Signed)
A derm referral has been placed (yesterday).  It's really not clear what her rash is at this point.  Shingles medication did not help her.  I thought it was disseminated shingles when I'd seen her a few weeks ago, but it was odd.  If she is terribly bothered by the new symptoms or it's spreading rapidly such that she can't wait until her appt next week, she should proceed to [facility] urgent care of the ED

## 2017-11-29 NOTE — Telephone Encounter (Signed)
Patient called to schedule an appointment for next Wednesday (pt declined appt on Tuesday) due to possibly having shingles. Patient described the blisters differently than she did earlier this morning. Pt now states that has had a rash for over 2 weeks on her left upper leg and left arm that has blisters that come and go. She stated that she has had blisters coming up between her toes and fingers for several days. She stated that the rash/blisters are spread out in no particular pattern across the surface of her skin. Pt states that the rash does not hurt unless it is bumped but it is itching. Pt stated that she has no put any type of medication on the rash.   Please advise on any recommendations for treatment prior to appt.

## 2017-11-29 NOTE — Telephone Encounter (Signed)
Patient verbalized understanding  

## 2017-12-01 DIAGNOSIS — L0103 Bullous impetigo: Secondary | ICD-10-CM | POA: Diagnosis not present

## 2017-12-04 ENCOUNTER — Ambulatory Visit: Payer: Medicare Other | Admitting: Internal Medicine

## 2017-12-04 ENCOUNTER — Encounter: Payer: Medicare Other | Admitting: Nurse Practitioner

## 2017-12-09 DIAGNOSIS — L120 Bullous pemphigoid: Secondary | ICD-10-CM | POA: Diagnosis not present

## 2017-12-11 ENCOUNTER — Telehealth: Payer: Self-pay

## 2017-12-11 ENCOUNTER — Encounter: Payer: Self-pay | Admitting: Internal Medicine

## 2017-12-11 ENCOUNTER — Ambulatory Visit (INDEPENDENT_AMBULATORY_CARE_PROVIDER_SITE_OTHER): Payer: Medicare Other | Admitting: Internal Medicine

## 2017-12-11 VITALS — BP 112/58 | HR 111 | Temp 97.8°F | Resp 10

## 2017-12-11 DIAGNOSIS — L120 Bullous pemphigoid: Secondary | ICD-10-CM | POA: Diagnosis not present

## 2017-12-11 MED ORDER — PREDNISONE 20 MG PO TABS: 60.0000 mg | ORAL_TABLET | Freq: Every day | ORAL | 0 refills | Status: DC

## 2017-12-11 NOTE — Progress Notes (Signed)
Patient ID: Stacey Clay, female   DOB: January 07, 1923, 84 y.o.   MRN: [id number]   Stacey Clay OFFICE  Provider: DR Arletha Grippe  Code Status:  Goals of Care:  Advanced Directives 11/14/2017  Does Patient Have a Medical Advance Directive? Yes  Type of Advance Directive Out of facility DNR (pink MOST or yellow form)  Does patient want to make changes to medical advance directive? No - Patient declined  Copy of [place] in Chart? -  Would patient like information on creating a medical advance directive? -  Pre-existing out of facility DNR order (yellow form or pink MOST form) Yellow form placed in chart (order not valid for inpatient use);Pink MOST form placed in chart (order not valid for inpatient use)     Chief Complaint  Patient presents with  . Follow-up    Follow-up form Urgent Care, patient seen for blisters on legs and feet. Patient is currently on antibiotic     HPI: Patient is a 84 y.o. female seen today for an acute visit for rash. She was seen in May by Dr Mariea Clonts and dx with disseminated shingles. She completed antiviral tx. She noticed increased numbers of firm vesicles and went to the UC. She was told they looked infected and was Rx bactrim-->clinda. She has applied bandaids to bullae to "keep them from breaking". No f/c. No itching. She tried to get dermatology appt but told it would be a month's wait. Her son is present. She lives alone. She has unsteady gait and has balance issues. She is unable to stand on scale for weight. She uses a rolling walker with seat to ambulate.  Past Medical History:  Diagnosis Date  . Arthritis    Osteoarthritis -knees, hips-"pseudo gout right knee" "curvature of spine"  . Complication of anesthesia   . Contracture of lower leg joint   . Disorder of bone and cartilage, unspecified   . Disturbance of skin sensation   . Gait instability    due to contractures and curvature to spine uses-walker to ambulate  . Hypoglycemia   .  Hypoglycemia, unspecified   . Pain in joint, lower leg   . Pain in joint, pelvic region and thigh   . PONV (postoperative nausea and vomiting)   . Restless legs syndrome (RLS)   . 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 radioactive Iodine s/p yrs after having thyroid goiter surgery  . Unspecified cataract    not surgically ready    Past Surgical History:  Procedure Laterality Date  . CHOLECYSTECTOMY    . goiter resection  1958  . TONSILLECTOMY  1932  . TOTAL HIP ARTHROPLASTY Left 11/28/2012   Procedure: LEFT TOTAL HIP ARTHROPLASTY ANTERIOR APPROACH;  Surgeon: Mcarthur Rossetti, MD;  Location: [facility];  Service: Orthopedics;  Laterality: Left;     reports that she has never smoked. She has never used smokeless tobacco. She reports that she does not drink alcohol or use drugs. Social History   Socioeconomic History  . Marital status: Widowed    Spouse name: Not on file  . Number of children: Not on file  . Years of education: Not on file  . Highest education level: Not on file  Occupational History  . Not on file  Social Needs  . Financial resource strain: Not hard at all  . Food insecurity:    Worry: Never true    Inability: Never  true  . Transportation needs:    Medical: No    Non-medical: No  Tobacco Use  . Smoking status: Never Smoker  . Smokeless tobacco: Never Used  Substance and Sexual Activity  . Alcohol use: No    Alcohol/week: 0.0 oz  . Drug use: No  . Sexual activity: Never  Lifestyle  . Physical activity:    Days per week: 0 days    Minutes per session: 0 min  . Stress: Not on file  Relationships  . Social connections:    Talks on phone: More than three times a week    Gets together: More than three times a week    Attends religious service: More than 4 times per year    Active member of club or organization: No    Attends meetings of clubs or organizations: Never     Relationship status: Widowed  . Intimate partner violence:    Fear of current or ex partner: No    Emotionally abused: No    Physically abused: No    Forced sexual activity: No  Other Topics Concern  . Not on file  Social History Narrative   Widowed. Lives alone   No pets   A moderate amount of caffeine beverages daily   No specific diet      Occupation: Scientist, water quality    Family History  Problem Relation Age of Onset  . Stroke Mother   . Stroke Father     Allergies  Allergen Reactions  . Aleve [Naproxen Sodium]     Stomach bleeding  . Aspirin     Stomach bleeding  . Darvocet [Propoxyphene N-Acetaminophen] Nausea And Vomiting  . Darvon [Propoxyphene Hcl] Nausea And Vomiting  . Lomotil [Diphenoxylate] Nausea And Vomiting  . Ultram [Tramadol] Nausea And Vomiting    Outpatient Encounter Medications as of 12/11/2017  Medication Sig  . clindamycin (CLEOCIN) 150 MG capsule 1 by mouth 3 times daily x 10 days  . diclofenac sodium (VOLTAREN) 1 % GEL APPLY 2 GRAMS TOPICALLY FOUR TIMES A DAY  . meclizine (ANTIVERT) 12.5 MG tablet Take 1 tablet (12.5 mg total) by mouth daily as needed for dizziness.  . Multiple Vitamin (MULTIVITAMIN) tablet Take 1 tablet by mouth daily.  . pregabalin (LYRICA) 50 MG capsule Take 1 capsule (50 mg total) by mouth 2 (two) times daily. For itching  . valACYclovir (VALTREX) 1000 MG tablet Take 1 tablet (1,000 mg total) by mouth 2 (two) times daily.   No facility-administered encounter medications on file as of 12/11/2017.     Review of Systems:  Review of Systems  Musculoskeletal: Positive for arthralgias, gait problem and joint swelling.  Skin: Positive for color change and rash.  All other systems reviewed and are negative.   Health Maintenance  Topic Date Due  . TETANUS/TDAP  01/16/1944  . INFLUENZA VACCINE  02/06/2018  . DEXA SCAN  Completed  . PNA vac Low Risk Adult  Completed    Physical Exam: Vitals:   12/11/17 1122  BP: (!)  112/58  Pulse: (!) 111  Resp: 10  Temp: 97.8 F (36.6 C)  TempSrc: Oral  SpO2: 93%   There is no height or weight on file to calculate BMI. Physical Exam  Constitutional: She is oriented to person, place, and time. She appears well-developed.  Frail appearing in NAD, HOH  Neurological: She is alert and oriented to person, place, and time.  Skin: Skin is warm and dry. Rash noted. Rash is vesicular and urticarial.  There is erythema.  Large, tense bullae on b/l thigh, hands with some hemorrhagic appearing and some weeping clear yellow fluid. She has multiple small bandaids covering bullae on thighs. Large area of eyrthema, irregular shaped of thighs. Some areas of ulceration at unroofed blisters of hands. No signs of secondary infection.   Psychiatric: She has a normal mood and affect. Her behavior is normal. Judgment and thought content normal.    Labs reviewed: Basic Metabolic Panel: Recent Labs    05/16/17 1211 10/14/17 1134  NA 140 141  K 4.6 5.0  CL 106 107  CO2 27 28  GLUCOSE 78 98  BUN 24 24  CREATININE 0.51* 0.57*  CALCIUM 9.3 9.7  TSH  --  1.95   Liver Function Tests: Recent Labs    10/14/17 1134  AST 18  ALT 11  BILITOT 0.7  PROT 6.5   No results for input(s): LIPASE, AMYLASE in the last 8760 hours. No results for input(s): AMMONIA in the last 8760 hours. CBC: Recent Labs    05/16/17 1211 10/14/17 1134  WBC 5.7 7.3  NEUTROABS 3,477 5,366  HGB 13.1 13.3  HCT 39.7 39.4  MCV 94.1 91.0  PLT 278 385   Lipid Panel: No results for input(s): CHOL, HDL, LDLCALC, TRIG, CHOLHDL, LDLDIRECT in the last 8760 hours. No results found for: HGBA1C  Procedures since last visit: No results found.  Assessment/Plan   ICD-10-CM   1. Bullous pemphigoid L12.0 predniSONE (DELTASONE) 20 MG tablet    Ambulatory referral to Dermatology    Ambulatory referral to [facility] 20MG  TAKE 3 TABS DAILY WITH FOOD FOR SKIN RASH  WILL CALL WITH DERMATOLOGY  REFERRAL  WRAP DRY THIGHS USING KERLIX CLING DAILY - NO BANDAIDS!. USE PAPER TAPE TO SECURE  Continue other medications as ordered  Will call with home health referral - needs wound care for weeping bullae lesions  STOP CLINDAMYCIN  Follow up with Dr Mariea Clonts as scheduled or sooner if need be  TIME SPENT: 30 minutes  Stacey Clay  [facility] [address] [place], [place] [address] [phone] Cell (Monday-Friday 8 AM - 5 PM) [phone] After 5 PM and follow prompts

## 2017-12-11 NOTE — Telephone Encounter (Signed)
Incoming call received:  Patient received call yesterday evening about 7pm from [facility] whom she seen on Sunday for blisters on legs and feet x 2 weeks. Patient was instructed in message to follow-up with PCP within 24 hours. Patient states no additional information was given.  Patient scheduled to see [name] today at 11:00am, [name] is out of office on Wednesday. I advised patient to contact [facility] and inform them that she received message and scheduled follow-up as instructed, we will need her records faxed to our office. I provided patient with phone number to [facility] and the fax number to our office.

## 2017-12-11 NOTE — Patient Instructions (Addendum)
START PREDNISONE 20MG  TAKE 3 TABS DAILY WITH FOOD FOR SKIN RASH  WILL CALL WITH DERMATOLOGY REFERRAL  WRAP DRY THIGHS USING KERLIX CLING DAILY - NO BANDAIDS!. USE PAPER TAPE TO SECURE  Continue other medications as ordered  Will call with home health referral  STOP CLINDAMYCIN  Follow up with Dr Mariea Clonts as scheduled or sooner if need be  Bullous Pemphigoid Bullous pemphigoid is a skin disease that causes blisters to form. It ranges in severity and can last for a long time. The disease can come back months or years after it goes away. What are the causes? The cause of this condition is not known. Certain medicines and conditions, such as diabetes and multiple sclerosis, may be causes. Bullous pemphigoid is an autoimmune disease. This means that the body's own defense system (immune system) attacks the body. What increases the risk? This condition is more likely to develop in people over the age of 1. What are the signs or symptoms? This condition causes blisters to form on the skin. In mild cases, only a few small blisters form. In severe cases, many large blisters form in several areas of the body. The most common places blisters form are the groin, armpits, trunk, thighs, and forearms. About one third of people with this condition develop blisters in the mouth. The blisters may break open, forming ulcers. Other symptoms of this condition include:  Redness.  Irritation.  Itching.  Bleeding gums.  Difficulty eating.  Cough.  Pain with swallowing.  Nosebleeds.  In most people, the symptoms of bullous pemphigoid go away within 5 years. How is this diagnosed? This condition may be diagnosed with a physical exam and blood tests. A procedure in which a skin sample is taken for testing (skin biopsy) may be done to confirm the diagnosis. How is this treated? This condition may be managed with medicines, such as:  Antibiotic medicines.  Steroid medicines. These may be applied to  the skin, taken by mouth, or given as injections.  Medicines that suppress the immune system.  If the mouth or lips are affected, a change in diet may also be recommended. People with severe symptoms may need to be treated at a hospital, where they may receive:  Care for their ulcers.  Medicines given through an IV tube.  Feedings given through an IV tube. This may be done if the mouth or lips are affected.  Follow these instructions at home:  Take medicines only as directed by your health care provider.  Keep your skin clean.  Do not scratch, break, or drain your blisters. Doing so can cause them to become infected.  Follow your health care provider's instructions about bandage (dressing) changes and removal.  If your mouth or lips are affected: ? Eat a diet made up of soft foods and liquids only. ? Avoid drinking very hot liquids. Contact a health care provider if:  Your itching or pain is not helped by medicine.  You develop redness, swelling, or pain that extends beyond your blisters or ulcers.  There is pus coming from a blister or ulcer.  You have a fever.  You have confusion.  You feel unusually tired or weak. Get help right away if:  Your pain becomes severe.  You cannot eat or drink because of blisters, ulcers, or pain in your lips or mouth.  You cannot care for yourself because of blisters, ulcers, or pain in your hands or in the soles of your feet. This information is not intended  to replace advice given to you by your health care provider. Make sure you discuss any questions you have with your health care provider. Document Released: 04/22/2007 Document Revised: 12/01/2015 Document Reviewed: 06/21/2014 Elsevier Interactive Patient Education  [employer].

## 2017-12-11 NOTE — Telephone Encounter (Signed)
Incoming records received form [facility]

## 2017-12-12 DIAGNOSIS — L120 Bullous pemphigoid: Secondary | ICD-10-CM | POA: Diagnosis not present

## 2017-12-12 DIAGNOSIS — Z85828 Personal history of other malignant neoplasm of skin: Secondary | ICD-10-CM | POA: Diagnosis not present

## 2017-12-13 DIAGNOSIS — Z7952 Long term (current) use of systemic steroids: Secondary | ICD-10-CM | POA: Diagnosis not present

## 2017-12-13 DIAGNOSIS — M160 Bilateral primary osteoarthritis of hip: Secondary | ICD-10-CM | POA: Diagnosis not present

## 2017-12-13 DIAGNOSIS — L120 Bullous pemphigoid: Secondary | ICD-10-CM | POA: Diagnosis not present

## 2017-12-13 DIAGNOSIS — B029 Zoster without complications: Secondary | ICD-10-CM | POA: Diagnosis not present

## 2017-12-13 DIAGNOSIS — G47419 Narcolepsy without cataplexy: Secondary | ICD-10-CM | POA: Diagnosis not present

## 2017-12-13 DIAGNOSIS — Z4800 Encounter for change or removal of nonsurgical wound dressing: Secondary | ICD-10-CM | POA: Diagnosis not present

## 2017-12-13 DIAGNOSIS — H8110 Benign paroxysmal vertigo, unspecified ear: Secondary | ICD-10-CM | POA: Diagnosis not present

## 2017-12-16 ENCOUNTER — Ambulatory Visit: Payer: Medicare Other | Admitting: Internal Medicine

## 2017-12-17 DIAGNOSIS — H8110 Benign paroxysmal vertigo, unspecified ear: Secondary | ICD-10-CM | POA: Diagnosis not present

## 2017-12-17 DIAGNOSIS — L120 Bullous pemphigoid: Secondary | ICD-10-CM | POA: Diagnosis not present

## 2017-12-17 DIAGNOSIS — Z4800 Encounter for change or removal of nonsurgical wound dressing: Secondary | ICD-10-CM | POA: Diagnosis not present

## 2017-12-17 DIAGNOSIS — G47419 Narcolepsy without cataplexy: Secondary | ICD-10-CM | POA: Diagnosis not present

## 2017-12-17 DIAGNOSIS — B029 Zoster without complications: Secondary | ICD-10-CM | POA: Diagnosis not present

## 2017-12-17 DIAGNOSIS — M160 Bilateral primary osteoarthritis of hip: Secondary | ICD-10-CM | POA: Diagnosis not present

## 2017-12-19 DIAGNOSIS — H8110 Benign paroxysmal vertigo, unspecified ear: Secondary | ICD-10-CM | POA: Diagnosis not present

## 2017-12-19 DIAGNOSIS — Z4800 Encounter for change or removal of nonsurgical wound dressing: Secondary | ICD-10-CM | POA: Diagnosis not present

## 2017-12-19 DIAGNOSIS — G47419 Narcolepsy without cataplexy: Secondary | ICD-10-CM | POA: Diagnosis not present

## 2017-12-19 DIAGNOSIS — M160 Bilateral primary osteoarthritis of hip: Secondary | ICD-10-CM | POA: Diagnosis not present

## 2017-12-19 DIAGNOSIS — L120 Bullous pemphigoid: Secondary | ICD-10-CM | POA: Diagnosis not present

## 2017-12-19 DIAGNOSIS — B029 Zoster without complications: Secondary | ICD-10-CM | POA: Diagnosis not present

## 2017-12-20 DIAGNOSIS — Z85828 Personal history of other malignant neoplasm of skin: Secondary | ICD-10-CM | POA: Diagnosis not present

## 2017-12-20 DIAGNOSIS — L120 Bullous pemphigoid: Secondary | ICD-10-CM | POA: Diagnosis not present

## 2017-12-23 DIAGNOSIS — M160 Bilateral primary osteoarthritis of hip: Secondary | ICD-10-CM | POA: Diagnosis not present

## 2017-12-23 DIAGNOSIS — L120 Bullous pemphigoid: Secondary | ICD-10-CM | POA: Diagnosis not present

## 2017-12-23 DIAGNOSIS — B029 Zoster without complications: Secondary | ICD-10-CM | POA: Diagnosis not present

## 2017-12-23 DIAGNOSIS — H8110 Benign paroxysmal vertigo, unspecified ear: Secondary | ICD-10-CM | POA: Diagnosis not present

## 2017-12-23 DIAGNOSIS — G47419 Narcolepsy without cataplexy: Secondary | ICD-10-CM | POA: Diagnosis not present

## 2017-12-23 DIAGNOSIS — Z4800 Encounter for change or removal of nonsurgical wound dressing: Secondary | ICD-10-CM | POA: Diagnosis not present

## 2017-12-24 DIAGNOSIS — H2513 Age-related nuclear cataract, bilateral: Secondary | ICD-10-CM | POA: Diagnosis not present

## 2017-12-24 DIAGNOSIS — H1851 Endothelial corneal dystrophy: Secondary | ICD-10-CM | POA: Diagnosis not present

## 2017-12-24 DIAGNOSIS — H04123 Dry eye syndrome of bilateral lacrimal glands: Secondary | ICD-10-CM | POA: Diagnosis not present

## 2017-12-24 DIAGNOSIS — H0011 Chalazion right upper eyelid: Secondary | ICD-10-CM | POA: Diagnosis not present

## 2017-12-25 DIAGNOSIS — H8110 Benign paroxysmal vertigo, unspecified ear: Secondary | ICD-10-CM | POA: Diagnosis not present

## 2017-12-25 DIAGNOSIS — M160 Bilateral primary osteoarthritis of hip: Secondary | ICD-10-CM | POA: Diagnosis not present

## 2017-12-25 DIAGNOSIS — L120 Bullous pemphigoid: Secondary | ICD-10-CM | POA: Diagnosis not present

## 2017-12-25 DIAGNOSIS — B029 Zoster without complications: Secondary | ICD-10-CM | POA: Diagnosis not present

## 2017-12-25 DIAGNOSIS — G47419 Narcolepsy without cataplexy: Secondary | ICD-10-CM | POA: Diagnosis not present

## 2017-12-25 DIAGNOSIS — Z4800 Encounter for change or removal of nonsurgical wound dressing: Secondary | ICD-10-CM | POA: Diagnosis not present

## 2017-12-26 ENCOUNTER — Other Ambulatory Visit: Payer: Self-pay | Admitting: Internal Medicine

## 2017-12-30 ENCOUNTER — Telehealth: Payer: Self-pay | Admitting: *Deleted

## 2017-12-30 DIAGNOSIS — B029 Zoster without complications: Secondary | ICD-10-CM | POA: Diagnosis not present

## 2017-12-30 DIAGNOSIS — L120 Bullous pemphigoid: Secondary | ICD-10-CM | POA: Diagnosis not present

## 2017-12-30 DIAGNOSIS — H8110 Benign paroxysmal vertigo, unspecified ear: Secondary | ICD-10-CM | POA: Diagnosis not present

## 2017-12-30 DIAGNOSIS — Z4800 Encounter for change or removal of nonsurgical wound dressing: Secondary | ICD-10-CM | POA: Diagnosis not present

## 2017-12-30 DIAGNOSIS — M160 Bilateral primary osteoarthritis of hip: Secondary | ICD-10-CM | POA: Diagnosis not present

## 2017-12-30 DIAGNOSIS — G47419 Narcolepsy without cataplexy: Secondary | ICD-10-CM | POA: Diagnosis not present

## 2017-12-30 NOTE — Telephone Encounter (Signed)
Freda Munro with [facility] called and stated that patient has a wound in her gluteal Fold Right leg, 1X1cm in circle, open and tender. No drainage or fever. Wants to know what type of dressing care. Please Advise.

## 2017-12-31 NOTE — Telephone Encounter (Signed)
Great, thanks, [employer].

## 2017-12-31 NOTE — Telephone Encounter (Signed)
Did she ever get seen by dermatology?  I can't find any evidence.

## 2017-12-31 NOTE — Telephone Encounter (Signed)
I spoke with [facility] and she verbalized understanding.   FYI: She stated that the area looked like one of the ruptured blisters that patient has been getting lately. It is the size of the top of a flat head pen and is perfectly circled.

## 2017-12-31 NOTE — Telephone Encounter (Signed)
I called [facility] and patient was seen on June 6th, 14th, and has an upcoming appointment on June 28th.   Notes from those visit will be faxed to the office.

## 2017-12-31 NOTE — Telephone Encounter (Signed)
I left a message for Adela Lank to call the office.

## 2017-12-31 NOTE — Telephone Encounter (Signed)
This may be the same wound she's had off and on for 2-3 years.  She was to be using a barrier cream to prevent this and changing positions often.  I know she sits on her rollator when she comes to the office which is not ideal.  Ok to cleanse with wound cleanser or normal saline, then apply a tegaderm and change every 3 days or prn soiling.  Of course, pt will not be able to do the dressing change on her own.

## 2018-01-02 DIAGNOSIS — L120 Bullous pemphigoid: Secondary | ICD-10-CM | POA: Diagnosis not present

## 2018-01-02 DIAGNOSIS — G47419 Narcolepsy without cataplexy: Secondary | ICD-10-CM | POA: Diagnosis not present

## 2018-01-02 DIAGNOSIS — Z4800 Encounter for change or removal of nonsurgical wound dressing: Secondary | ICD-10-CM | POA: Diagnosis not present

## 2018-01-02 DIAGNOSIS — M160 Bilateral primary osteoarthritis of hip: Secondary | ICD-10-CM | POA: Diagnosis not present

## 2018-01-02 DIAGNOSIS — H8110 Benign paroxysmal vertigo, unspecified ear: Secondary | ICD-10-CM | POA: Diagnosis not present

## 2018-01-02 DIAGNOSIS — B029 Zoster without complications: Secondary | ICD-10-CM | POA: Diagnosis not present

## 2018-01-03 DIAGNOSIS — L120 Bullous pemphigoid: Secondary | ICD-10-CM | POA: Diagnosis not present

## 2018-01-03 DIAGNOSIS — Z85828 Personal history of other malignant neoplasm of skin: Secondary | ICD-10-CM | POA: Diagnosis not present

## 2018-01-08 DIAGNOSIS — Z4800 Encounter for change or removal of nonsurgical wound dressing: Secondary | ICD-10-CM | POA: Diagnosis not present

## 2018-01-08 DIAGNOSIS — B029 Zoster without complications: Secondary | ICD-10-CM | POA: Diagnosis not present

## 2018-01-08 DIAGNOSIS — H8110 Benign paroxysmal vertigo, unspecified ear: Secondary | ICD-10-CM | POA: Diagnosis not present

## 2018-01-08 DIAGNOSIS — M160 Bilateral primary osteoarthritis of hip: Secondary | ICD-10-CM | POA: Diagnosis not present

## 2018-01-08 DIAGNOSIS — L120 Bullous pemphigoid: Secondary | ICD-10-CM | POA: Diagnosis not present

## 2018-01-08 DIAGNOSIS — G47419 Narcolepsy without cataplexy: Secondary | ICD-10-CM | POA: Diagnosis not present

## 2018-01-10 ENCOUNTER — Telehealth: Payer: Self-pay | Admitting: *Deleted

## 2018-01-10 NOTE — Telephone Encounter (Signed)
noted 

## 2018-01-10 NOTE — Telephone Encounter (Signed)
Patient called and said she has a reddened area across her left foot. She said she has been putting "cream" on it and now the skin has become dried. She wanted to be seen today. Advised no availability in the office and suggested [facility] to rule out shingles, etc. She said she would try to get someone to drive her.

## 2018-01-17 ENCOUNTER — Telehealth: Payer: Self-pay

## 2018-01-17 NOTE — Telephone Encounter (Signed)
Please schedule in next opening.

## 2018-01-17 NOTE — Telephone Encounter (Signed)
Next opening is 01/28/18 with Dr.Hopper  Patient called back to check status of response, patient aware of [name]'s response and stated she can not wait until next opening.   Patient will seek medical attention at Urgent Care

## 2018-01-17 NOTE — Telephone Encounter (Signed)
Patient called c/o recent fall  1. How did you fall? Patient was getting out of bed in the middle of night, tried to hold on to walker (4 wheels and a seat) and lost control of walker   2. When did you fall? Fall occurred in the middle of night on Monday  3. Did you hit your head? No  4. Are you on any blood thinners (check medication list) No   5. Any visible injuries? Small bruise on right arm about 1 inch long   6 Any symptoms before you fell like chest pain, dizziness, or weakness? No   Patient was requesting appointment today, no available appointments. Patient sates her right hand and are is shaky and needs to be examined. Patient denied pain.  Please advise

## 2018-01-20 NOTE — Telephone Encounter (Signed)
She really needs a physician or nurse practitioner to look at her and figure out why she's shaking. I'm happy to have her get some home health therapy, but I'm concerned we don't know what injury she sustained from the fall.

## 2018-01-20 NOTE — Telephone Encounter (Signed)
Patient stated that she would try to go to urgent care in the morning.

## 2018-01-20 NOTE — Telephone Encounter (Signed)
Patient called to request that Dr. Mariea Clonts get her set up with [facility] ASAP due to right hand and shoulder pain from a fall last Monday. Patient states that she cannot control the shaking in her hand and arm nor can she raise her arm above her head.   Patient stated that she did not go to urgent care Friday as she stated she would.   Please advise

## 2018-01-28 DIAGNOSIS — L120 Bullous pemphigoid: Secondary | ICD-10-CM | POA: Diagnosis not present

## 2018-02-03 DIAGNOSIS — Z85828 Personal history of other malignant neoplasm of skin: Secondary | ICD-10-CM | POA: Diagnosis not present

## 2018-02-03 DIAGNOSIS — L120 Bullous pemphigoid: Secondary | ICD-10-CM | POA: Diagnosis not present

## 2018-02-03 DIAGNOSIS — L98499 Non-pressure chronic ulcer of skin of other sites with unspecified severity: Secondary | ICD-10-CM | POA: Diagnosis not present

## 2018-02-13 ENCOUNTER — Ambulatory Visit: Payer: Medicare Other | Admitting: Internal Medicine

## 2018-03-03 ENCOUNTER — Ambulatory Visit (INDEPENDENT_AMBULATORY_CARE_PROVIDER_SITE_OTHER): Payer: Medicare Other | Admitting: Orthopaedic Surgery

## 2018-03-03 ENCOUNTER — Encounter (INDEPENDENT_AMBULATORY_CARE_PROVIDER_SITE_OTHER): Payer: Self-pay | Admitting: Orthopaedic Surgery

## 2018-03-03 ENCOUNTER — Ambulatory Visit (INDEPENDENT_AMBULATORY_CARE_PROVIDER_SITE_OTHER): Payer: Medicare Other

## 2018-03-03 DIAGNOSIS — M25511 Pain in right shoulder: Secondary | ICD-10-CM | POA: Diagnosis not present

## 2018-03-03 DIAGNOSIS — R29898 Other symptoms and signs involving the musculoskeletal system: Secondary | ICD-10-CM | POA: Diagnosis not present

## 2018-03-03 NOTE — Progress Notes (Signed)
Office Visit Note   Patient: ADDILYN SATTERWHITE           Date of Birth: 1923/05/20           MRN: [id number] Visit Date: 03/03/2018              Requested by: Gayland Curry, DO [place], [place] [id number] PCP: Gayland Curry, DO   Assessment & Plan: Visit Diagnoses:  1. Shoulder weakness   2. Acute pain of right shoulder     Plan: There is really nothing to do at this standpoint since she is truly back to her baseline with minimal discomfort.  The family had reassurance that nothing was broken.  They understand that she will always have functional limitations with that shoulder.  All questions concerns were answered and addressed.  Follow-up as needed.  Follow-Up Instructions: Return if symptoms worsen or fail to improve.   Orders:  Orders Placed This Encounter  Procedures  . XR Shoulder Right   No orders of the defined types were placed in this encounter.     Procedures: No procedures performed   Clinical Data: No additional findings.   Subjective: Chief Complaint  Patient presents with  . Right Shoulder - Pain  Patient is a very pleasant 84 year old female well-known to me.  She had a mechanical fall recently injuring her right shoulder and her back.  This is about a month ago.  The back pain is resolved but she still having some pain in her right shoulder.  She does ambulate using a rolling walker.  She said now the pain is not bad with the shoulder since she set up the appointment.  She still has limitations with function shoulder but that is a chronic issue.  HPI  Review of Systems She currently denies any headache, chest pain, shortness of breath, fever, chills, nausea, vomiting.  Objective: Vital Signs: There were no vitals taken for this visit.  Physical Exam She is alert and oriented x3 and in no acute distress Ortho Exam Examination of her right shoulder does show some functional limitations of the rotator cuff with weakness and some  grinding of the glenohumeral joint but minimal discomfort. Specialty Comments:  No specialty comments available.  Imaging: Xr Shoulder Right  Result Date: 03/03/2018 3 views of the right shoulder showed no acute findings.  There is severe arthritic changes around the shoulder but it is well located.    PMFS History: Patient Active Problem List   Diagnosis Date Noted  . 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 Thyrotoxicosis   . Pseudogout of right knee 10/01/2013  . Long term (current) use of anticoagulants 02/13/2013  . Generalized osteoarthritis of multiple sites 01/01/2013  . Acute posthemorrhagic anemia 12/25/2012  . Benign paroxysmal positional vertigo 12/25/2012  . Degenerative arthritis of hip 11/28/2012  . DJD (degenerative joint disease) of hip 10/23/2012   Past Medical History:  Diagnosis Date  . Arthritis    Osteoarthritis -knees, hips-"pseudo gout right knee" "curvature of spine"  . Complication of anesthesia   . Contracture of lower leg joint   . Disorder of bone and cartilage, unspecified   . Disturbance of skin sensation   . Gait instability    due to contractures and curvature to spine uses-walker to ambulate  . Hypoglycemia   . Hypoglycemia, unspecified   . Pain in joint, lower leg   . Pain in joint, pelvic region and thigh   . PONV (postoperative nausea and  vomiting)   . Restless legs syndrome (RLS)   . 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 radioactive Iodine s/p yrs after having thyroid goiter surgery  . Unspecified cataract    not surgically ready    Family History  Problem Relation Age of Onset  . Stroke Mother   . Stroke Father     Past Surgical History:  Procedure Laterality Date  . CHOLECYSTECTOMY    . goiter resection  1958  . TONSILLECTOMY  1932  . TOTAL HIP ARTHROPLASTY Left 11/28/2012   Procedure: LEFT TOTAL HIP ARTHROPLASTY ANTERIOR APPROACH;  Surgeon:  Mcarthur Rossetti, MD;  Location: [facility];  Service: Orthopedics;  Laterality: Left;   Social History   Occupational History  . Not on file  Tobacco Use  . Smoking status: Never Smoker  . Smokeless tobacco: Never Used  Substance and Sexual Activity  . Alcohol use: No    Alcohol/week: 0.0 standard drinks  . Drug use: No  . Sexual activity: Never

## 2018-03-07 DIAGNOSIS — L821 Other seborrheic keratosis: Secondary | ICD-10-CM | POA: Diagnosis not present

## 2018-03-07 DIAGNOSIS — Z85828 Personal history of other malignant neoplasm of skin: Secondary | ICD-10-CM | POA: Diagnosis not present

## 2018-03-07 DIAGNOSIS — L120 Bullous pemphigoid: Secondary | ICD-10-CM | POA: Diagnosis not present

## 2018-04-11 DIAGNOSIS — Z85828 Personal history of other malignant neoplasm of skin: Secondary | ICD-10-CM | POA: Diagnosis not present

## 2018-04-11 DIAGNOSIS — L120 Bullous pemphigoid: Secondary | ICD-10-CM | POA: Diagnosis not present

## 2018-04-24 DIAGNOSIS — Z23 Encounter for immunization: Secondary | ICD-10-CM | POA: Diagnosis not present

## 2018-05-12 DIAGNOSIS — L120 Bullous pemphigoid: Secondary | ICD-10-CM | POA: Diagnosis not present

## 2018-05-12 DIAGNOSIS — Z85828 Personal history of other malignant neoplasm of skin: Secondary | ICD-10-CM | POA: Diagnosis not present

## 2018-06-23 DIAGNOSIS — L120 Bullous pemphigoid: Secondary | ICD-10-CM | POA: Diagnosis not present

## 2018-06-23 DIAGNOSIS — Z85828 Personal history of other malignant neoplasm of skin: Secondary | ICD-10-CM | POA: Diagnosis not present

## 2018-07-17 DIAGNOSIS — H04123 Dry eye syndrome of bilateral lacrimal glands: Secondary | ICD-10-CM | POA: Diagnosis not present

## 2018-07-17 DIAGNOSIS — H2513 Age-related nuclear cataract, bilateral: Secondary | ICD-10-CM | POA: Diagnosis not present

## 2018-08-02 ENCOUNTER — Other Ambulatory Visit: Payer: Self-pay | Admitting: Internal Medicine

## 2018-08-05 DIAGNOSIS — H6123 Impacted cerumen, bilateral: Secondary | ICD-10-CM | POA: Diagnosis not present

## 2018-08-11 DIAGNOSIS — Z85828 Personal history of other malignant neoplasm of skin: Secondary | ICD-10-CM | POA: Diagnosis not present

## 2018-08-11 DIAGNOSIS — L120 Bullous pemphigoid: Secondary | ICD-10-CM | POA: Diagnosis not present

## 2018-09-01 ENCOUNTER — Ambulatory Visit: Payer: Self-pay

## 2018-09-01 ENCOUNTER — Encounter: Payer: Medicare Other | Admitting: Family

## 2018-09-02 ENCOUNTER — Encounter: Payer: Medicare Other | Admitting: Family

## 2018-09-30 DIAGNOSIS — L120 Bullous pemphigoid: Secondary | ICD-10-CM | POA: Diagnosis not present

## 2018-09-30 DIAGNOSIS — Z85828 Personal history of other malignant neoplasm of skin: Secondary | ICD-10-CM | POA: Diagnosis not present

## 2019-02-09 ENCOUNTER — Other Ambulatory Visit: Payer: Self-pay

## 2019-02-11 DIAGNOSIS — L821 Other seborrheic keratosis: Secondary | ICD-10-CM | POA: Diagnosis not present

## 2019-02-11 DIAGNOSIS — L120 Bullous pemphigoid: Secondary | ICD-10-CM | POA: Diagnosis not present

## 2019-02-11 DIAGNOSIS — Z85828 Personal history of other malignant neoplasm of skin: Secondary | ICD-10-CM | POA: Diagnosis not present

## 2019-05-19 DIAGNOSIS — L120 Bullous pemphigoid: Secondary | ICD-10-CM | POA: Diagnosis not present

## 2019-05-19 DIAGNOSIS — L821 Other seborrheic keratosis: Secondary | ICD-10-CM | POA: Diagnosis not present

## 2019-05-19 DIAGNOSIS — Z85828 Personal history of other malignant neoplasm of skin: Secondary | ICD-10-CM | POA: Diagnosis not present

## 2019-06-29 ENCOUNTER — Telehealth: Payer: Self-pay | Admitting: Internal Medicine

## 2019-06-29 NOTE — Telephone Encounter (Signed)
LVM to have patient schedule AWV - can be a telephone visit

## 2019-08-11 ENCOUNTER — Encounter: Payer: Self-pay | Admitting: Internal Medicine

## 2020-02-04 ENCOUNTER — Emergency Department (HOSPITAL_COMMUNITY): Payer: Medicare Other

## 2020-02-04 ENCOUNTER — Inpatient Hospital Stay (HOSPITAL_COMMUNITY)
Admission: EM | Admit: 2020-02-04 | Discharge: 2020-02-07 | DRG: 871 | Disposition: A | Discharge status: Hospice | Payer: Medicare Other | Attending: Internal Medicine | Admitting: Internal Medicine

## 2020-02-04 DIAGNOSIS — F0391 Unspecified dementia with behavioral disturbance: Secondary | ICD-10-CM | POA: Diagnosis present

## 2020-02-04 DIAGNOSIS — Z888 Allergy status to other drugs, medicaments and biological substances status: Secondary | ICD-10-CM

## 2020-02-04 DIAGNOSIS — R6521 Severe sepsis with septic shock: Secondary | ICD-10-CM | POA: Diagnosis present

## 2020-02-04 DIAGNOSIS — Z7189 Other specified counseling: Secondary | ICD-10-CM

## 2020-02-04 DIAGNOSIS — Z79899 Other long term (current) drug therapy: Secondary | ICD-10-CM

## 2020-02-04 DIAGNOSIS — Z886 Allergy status to analgesic agent status: Secondary | ICD-10-CM

## 2020-02-04 DIAGNOSIS — Z7401 Bed confinement status: Secondary | ICD-10-CM

## 2020-02-04 DIAGNOSIS — Z9049 Acquired absence of other specified parts of digestive tract: Secondary | ICD-10-CM

## 2020-02-04 DIAGNOSIS — R5383 Other fatigue: Secondary | ICD-10-CM | POA: Diagnosis not present

## 2020-02-04 DIAGNOSIS — I4891 Unspecified atrial fibrillation: Secondary | ICD-10-CM | POA: Diagnosis not present

## 2020-02-04 DIAGNOSIS — G47419 Narcolepsy without cataplexy: Secondary | ICD-10-CM | POA: Diagnosis present

## 2020-02-04 DIAGNOSIS — A419 Sepsis, unspecified organism: Principal | ICD-10-CM | POA: Diagnosis present

## 2020-02-04 DIAGNOSIS — R000 Tachycardia, unspecified: Secondary | ICD-10-CM | POA: Diagnosis not present

## 2020-02-04 DIAGNOSIS — I959 Hypotension, unspecified: Secondary | ICD-10-CM | POA: Diagnosis present

## 2020-02-04 DIAGNOSIS — Z20822 Contact with and (suspected) exposure to covid-19: Secondary | ICD-10-CM | POA: Diagnosis not present

## 2020-02-04 DIAGNOSIS — K921 Melena: Secondary | ICD-10-CM | POA: Diagnosis present

## 2020-02-04 DIAGNOSIS — R0902 Hypoxemia: Secondary | ICD-10-CM | POA: Diagnosis present

## 2020-02-04 DIAGNOSIS — G2581 Restless legs syndrome: Secondary | ICD-10-CM | POA: Diagnosis present

## 2020-02-04 DIAGNOSIS — Z66 Do not resuscitate: Secondary | ICD-10-CM | POA: Diagnosis not present

## 2020-02-04 DIAGNOSIS — Z7952 Long term (current) use of systemic steroids: Secondary | ICD-10-CM

## 2020-02-04 DIAGNOSIS — Z885 Allergy status to narcotic agent status: Secondary | ICD-10-CM

## 2020-02-04 DIAGNOSIS — R58 Hemorrhage, not elsewhere classified: Secondary | ICD-10-CM | POA: Diagnosis not present

## 2020-02-04 DIAGNOSIS — E46 Unspecified protein-calorie malnutrition: Secondary | ICD-10-CM | POA: Diagnosis not present

## 2020-02-04 DIAGNOSIS — N179 Acute kidney failure, unspecified: Secondary | ICD-10-CM | POA: Diagnosis not present

## 2020-02-04 DIAGNOSIS — F03C0 Unspecified dementia, severe, without behavioral disturbance, psychotic disturbance, mood disturbance, and anxiety: Secondary | ICD-10-CM | POA: Diagnosis present

## 2020-02-04 DIAGNOSIS — I491 Atrial premature depolarization: Secondary | ICD-10-CM | POA: Diagnosis not present

## 2020-02-04 DIAGNOSIS — Z515 Encounter for palliative care: Secondary | ICD-10-CM

## 2020-02-04 DIAGNOSIS — D649 Anemia, unspecified: Secondary | ICD-10-CM | POA: Diagnosis not present

## 2020-02-04 DIAGNOSIS — R451 Restlessness and agitation: Secondary | ICD-10-CM | POA: Diagnosis not present

## 2020-02-04 DIAGNOSIS — D8481 Immunodeficiency due to conditions classified elsewhere: Secondary | ICD-10-CM | POA: Diagnosis not present

## 2020-02-04 DIAGNOSIS — Z96642 Presence of left artificial hip joint: Secondary | ICD-10-CM | POA: Diagnosis present

## 2020-02-04 DIAGNOSIS — Z823 Family history of stroke: Secondary | ICD-10-CM

## 2020-02-04 DIAGNOSIS — R0689 Other abnormalities of breathing: Secondary | ICD-10-CM | POA: Diagnosis not present

## 2020-02-04 DIAGNOSIS — K625 Hemorrhage of anus and rectum: Secondary | ICD-10-CM

## 2020-02-04 DIAGNOSIS — D62 Acute posthemorrhagic anemia: Secondary | ICD-10-CM | POA: Diagnosis present

## 2020-02-04 LAB — COMPREHENSIVE METABOLIC PANEL
ALT: 15 U/L (ref 0–44)
AST: 20 U/L (ref 15–41)
Albumin: 2.2 g/dL — ABNORMAL LOW (ref 3.5–5.0)
Alkaline Phosphatase: 64 U/L (ref 38–126)
Anion gap: 10 (ref 5–15)
BUN: 43 mg/dL — ABNORMAL HIGH (ref 8–23)
CO2: 20 mmol/L — ABNORMAL LOW (ref 22–32)
Calcium: 8.6 mg/dL — ABNORMAL LOW (ref 8.9–10.3)
Chloride: 114 mmol/L — ABNORMAL HIGH (ref 98–111)
Creatinine, Ser: 1.24 mg/dL — ABNORMAL HIGH (ref 0.44–1.00)
GFR calc Af Amer: 43 mL/min — ABNORMAL LOW (ref >60)
GFR calc non Af Amer: 37 mL/min — ABNORMAL LOW (ref >60)
Glucose, Bld: 94 mg/dL (ref 70–99)
Potassium: 4.4 mmol/L (ref 3.5–5.1)
Sodium: 144 mmol/L (ref 135–145)
Total Bilirubin: 0.5 mg/dL (ref 0.3–1.2)
Total Protein: 5.3 g/dL — ABNORMAL LOW (ref 6.5–8.1)

## 2020-02-04 LAB — CBC WITH DIFFERENTIAL/PLATELET
Abs Immature Granulocytes: 0.02 10*3/uL (ref 0.00–0.07)
Basophils Absolute: 0 10*3/uL (ref 0.0–0.1)
Basophils Relative: 0 %
Eosinophils Absolute: 0 10*3/uL (ref 0.0–0.5)
Eosinophils Relative: 0 %
HCT: 23.8 % — ABNORMAL LOW (ref 36.0–46.0)
Hemoglobin: 6.2 g/dL — CL (ref 12.0–15.0)
Immature Granulocytes: 0 %
Lymphocytes Relative: 2 %
Lymphs Abs: 0.2 10*3/uL — ABNORMAL LOW (ref 0.7–4.0)
MCH: 18.8 pg — ABNORMAL LOW (ref 26.0–34.0)
MCHC: 26.1 g/dL — ABNORMAL LOW (ref 30.0–36.0)
MCV: 72.1 fL — ABNORMAL LOW (ref 80.0–100.0)
Monocytes Absolute: 0.6 10*3/uL (ref 0.1–1.0)
Monocytes Relative: 6 %
Neutro Abs: 10 10*3/uL — ABNORMAL HIGH (ref 1.7–7.7)
Neutrophils Relative %: 92 %
Platelets: 529 10*3/uL — ABNORMAL HIGH (ref 150–400)
RBC: 3.3 MIL/uL — ABNORMAL LOW (ref 3.87–5.11)
RDW: 20.1 % — ABNORMAL HIGH (ref 11.5–15.5)
WBC: 10.9 10*3/uL — ABNORMAL HIGH (ref 4.0–10.5)
nRBC: 0.6 % — ABNORMAL HIGH (ref 0.0–0.2)

## 2020-02-04 LAB — POC OCCULT BLOOD, ED: Fecal Occult Bld: POSITIVE — AB

## 2020-02-04 LAB — PROTIME-INR
INR: 1.2 (ref 0.8–1.2)
Prothrombin Time: 14.4 seconds (ref 11.4–15.2)

## 2020-02-04 LAB — LACTIC ACID, PLASMA: Lactic Acid, Venous: 3 mmol/L (ref 0.5–1.9)

## 2020-02-04 LAB — APTT: aPTT: 29 seconds (ref 24–36)

## 2020-02-04 MED ORDER — SODIUM CHLORIDE 0.9 % IV BOLUS (SEPSIS)
1000.0000 mL | Freq: Once | INTRAVENOUS | Status: AC
  Administered 2020-02-04: 1000 mL via INTRAVENOUS

## 2020-02-04 MED ORDER — SODIUM CHLORIDE 0.9% IV SOLUTION: Freq: Once | INTRAVENOUS | Status: DC

## 2020-02-04 NOTE — ED Triage Notes (Signed)
Pt. BIB [facility] from home. C/O dark and bright red blood in stool, diarrhea, and increased lethargy per family. Pt has hx of dementia. EMS VS: BP 128/60 (after 500cc bolus) 90/50 initially, pulse 130, CBG 158, Temp 98, 97% O2 on RA, and RR 30.

## 2020-02-04 NOTE — ED Provider Notes (Signed)
[facility] EMERGENCY DEPARTMENT Provider Note   CSN: [id number] Arrival date & time: 02/04/20  2154     History Chief Complaint  Patient presents with  . Blood In Stools  . Fatigue    Stacey Clay is a 86 y.o. female with a hx of dementia, thyrotoxicosis presents to the Emergency Department via EMS with husband who gives hx as patient is unable to do so.   Level 5 CAVEAT for AMS, dementia and acuity of condition.    Per husband, patient was in her usual state of health today when around 9:30 she had a large bowel movement which has been described as runny and bloody.  He stated significant amount of red blood and some dark stool as well.  Patient with associated emesis however patient husband denies hematemesis.  Husband denies use of anticoagulants however chart review shows history of long-term current use of anticoagulants.  No anticoagulant listed on her medication list.  Denies sick contacts.  He reports due to severe and progressive dementia hospice has been initiated but has not yet consulted.  He does wish for patient to be a DNI/DNR.  He does want fluids and blood if needed.  No known aggravating or alleviating factors.  Denies a history of GI bleeding.  She has had 1 shot of the Covid vaccine series but has not had the second.  The history is provided by medical records and the spouse. The history is limited by the condition of the patient. No language interpreter was used.       Past Medical History:  Diagnosis Date  . Arthritis    Osteoarthritis -knees, hips-"pseudo gout right knee" "curvature of spine"  . Complication of anesthesia   . Contracture of lower leg joint   . Disorder of bone and cartilage, unspecified   . Disturbance of skin sensation   . Gait instability    due to contractures and curvature to spine uses-walker to ambulate  . Hypoglycemia   . Hypoglycemia, unspecified   . Pain in joint, lower leg   . Pain in joint, pelvic region and  thigh   . PONV (postoperative nausea and vomiting)   . Restless legs syndrome (RLS)   . 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 radioactive Iodine s/p yrs after having thyroid goiter surgery  . Unspecified cataract    not surgically ready    Patient Active Problem List   Diagnosis Date Noted  . 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 Thyrotoxicosis   . Pseudogout of right knee 10/01/2013  . Long term (current) use of anticoagulants 02/13/2013  . Generalized osteoarthritis of multiple sites 01/01/2013  . Acute posthemorrhagic anemia 12/25/2012  . Benign paroxysmal positional vertigo 12/25/2012  . Degenerative arthritis of hip 11/28/2012  . DJD (degenerative joint disease) of hip 10/23/2012    Past Surgical History:  Procedure Laterality Date  . CHOLECYSTECTOMY    . goiter resection  1958  . TONSILLECTOMY  1932  . TOTAL HIP ARTHROPLASTY Left 11/28/2012   Procedure: LEFT TOTAL HIP ARTHROPLASTY ANTERIOR APPROACH;  Surgeon: Mcarthur Rossetti, MD;  Location: [facility];  Service: Orthopedics;  Laterality: Left;     OB History   No obstetric history on file.     Family History  Problem Relation Age of Onset  . Stroke Mother   . Stroke Father     Social History   Tobacco Use  .  Smoking status: Never Smoker  . Smokeless tobacco: Never Used  Substance Use Topics  . Alcohol use: No    Alcohol/week: 0.0 standard drinks  . Drug use: No    Home Medications Prior to Admission medications   Medication Sig Start Date End Date Taking? Authorizing Provider  diclofenac sodium (VOLTAREN) 1 % GEL APPLY 2 GRAMS TOPICALLY FOUR TIMES A DAY 08/04/18   Reed, Tiffany L, DO  meclizine (ANTIVERT) 12.5 MG tablet Take 1 tablet (12.5 mg total) by mouth daily as needed for dizziness. 10/25/16   Reed, Tiffany L, DO  Multiple Vitamin (MULTIVITAMIN) tablet Take 1 tablet by mouth daily.    [provider]  predniSONE  (DELTASONE) 20 MG tablet Take 3 tablets (60 mg total) by mouth daily with breakfast. For rash 12/11/17   Gildardo Cranker, DO  pregabalin (LYRICA) 50 MG capsule Take 1 capsule (50 mg total) by mouth 2 (two) times daily. For itching 11/14/17   Reed, Tiffany L, DO    Allergies    Aleve [naproxen sodium], Aspirin, Darvocet [propoxyphene n-acetaminophen], Darvon [propoxyphene hcl], Lomotil [diphenoxylate], and Ultram [tramadol]  Review of Systems   Review of Systems  Unable to perform ROS: Dementia    Physical Exam Updated Vital Signs BP (!) 105/48   Pulse (!) 115   Temp 98 F (36.7 C) (Oral)   Resp (!) 25   SpO2 97%   Physical Exam Vitals and nursing note reviewed.  Constitutional:      General: She is in acute distress.     Appearance: She is ill-appearing. She is not diaphoretic.  HENT:     Head: Normocephalic.     Nose: Nose normal.  Eyes:     General: No scleral icterus.    Pupils: Pupils are equal, round, and reactive to light.  Cardiovascular:     Rate and Rhythm: Tachycardia present. Rhythm irregular.     Pulses:          Radial pulses are 1+ on the right side and 1+ on the left side.  Pulmonary:     Effort: Tachypnea present. No accessory muscle usage, prolonged expiration, respiratory distress or retractions.     Breath sounds: Normal breath sounds. No stridor.     Comments: Equal chest rise. No increased work of breathing. Abdominal:     General: There is no distension.     Palpations: Abdomen is soft.     Tenderness: There is generalized abdominal tenderness. There is guarding. There is no rebound.  Genitourinary:    Rectum: Guaiac result positive.     Comments: DRE with red and brown mixed stool. Musculoskeletal:     Cervical back: Normal range of motion.     Comments: Lower legs contracted.  Moves upper extremities equally.  Skin:    General: Skin is cool and dry.     Capillary Refill: Capillary refill takes more than 3 seconds.     Coloration: Skin is pale.       Comments: Mucous membranes pale  Neurological:     Mental Status: She is lethargic.     GCS: GCS eye subscore is 4. GCS verbal subscore is 5. GCS motor subscore is 6.     Comments: Patient answers no questions.  Consistently calling her husband's name.  Psychiatric:        Behavior: Behavior is agitated.     ED Results / Procedures / Treatments   Labs (all labs ordered are listed, but only abnormal results are displayed) Labs  Reviewed  LACTIC ACID, PLASMA - Abnormal; Notable for the following components:      Result Value   Lactic Acid, Venous 3.0 (*)    All other components within normal limits  COMPREHENSIVE METABOLIC PANEL - Abnormal; Notable for the following components:   Chloride 114 (*)    CO2 20 (*)    BUN 43 (*)    Creatinine, Ser 1.24 (*)    Calcium 8.6 (*)    Total Protein 5.3 (*)    Albumin 2.2 (*)    GFR calc non Af Amer 37 (*)    GFR calc Af Amer 43 (*)    All other components within normal limits  CBC WITH DIFFERENTIAL/PLATELET - Abnormal; Notable for the following components:   WBC 10.9 (*)    RBC 3.30 (*)    Hemoglobin 6.2 (*)    HCT 23.8 (*)    MCV 72.1 (*)    MCH 18.8 (*)    MCHC 26.1 (*)    RDW 20.1 (*)    Platelets 529 (*)    nRBC 0.6 (*)    Neutro Abs 10.0 (*)    Lymphs Abs 0.2 (*)    All other components within normal limits  POC OCCULT BLOOD, ED - Abnormal; Notable for the following components:   Fecal Occult Bld POSITIVE (*)    All other components within normal limits  I-STAT CHEM 8, ED - Abnormal; Notable for the following components:   BUN 40 (*)    Creatinine, Ser 1.30 (*)    TCO2 19 (*)    Hemoglobin 6.5 (*)    HCT 19.0 (*)    All other components within normal limits  CULTURE, BLOOD (SINGLE)  URINE CULTURE  SARS CORONAVIRUS 2 BY RT PCR (HOSPITAL ORDER, [facility])  PROTIME-INR  APTT  LACTIC ACID, PLASMA  URINALYSIS, ROUTINE W REFLEX MICROSCOPIC  TYPE AND SCREEN  PREPARE RBC (CROSSMATCH)     EKG EKG Interpretation  Date/Time:  Thursday February 04 2020 22:33:29 EDT Ventricular Rate:  123 PR Interval:    QRS Duration: 143 QT Interval:  314 QTC Calculation: 450 R Axis:   84 Text Interpretation: Atrial fibrillation Ventricular premature complex Nonspecific intraventricular conduction delay Artifact in lead(s) I II III aVR aVL aVF V1 No old tracing to compare Confirmed by Sherwood Gambler [phone]) on 02/04/2020 11:06:39 PM   Radiology DG Chest Port 1 View  Result Date: 02/04/2020 CLINICAL DATA:  Questionable sepsis.  Blood in stool.  Lethargy. EXAM: PORTABLE CHEST 1 VIEW COMPARISON:  None. FINDINGS: Heart is normal in size. Normal mediastinal contours. There is ill-defined right paratracheal soft tissue density. No confluent airspace disease. No pneumothorax or pleural effusion. No pulmonary edema. Advanced right shoulder osteoarthritis. Scoliotic curvature in the spine. Bones are under mineralized. IMPRESSION: Ill-defined right paratracheal soft tissue density. This may be overlapping vascular structures, however cannot exclude underlying adenopathy or mass. Electronically Signed   By: Keith Rake M.D.   On: 02/04/2020 22:48    Procedures .Critical Care Performed by: Abigail Butts, PA-C Authorized by: Abigail Butts, PA-C   Critical care provider statement:    Critical care time (minutes):  65   Critical care time was exclusive of:  Separately billable procedures and treating other patients and teaching time   Critical care was necessary to treat or prevent imminent or life-threatening deterioration of the following conditions:  Shock   Critical care was time spent personally by me on the following activities:  Discussions with consultants, evaluation of patient's response to treatment, examination of patient, ordering and performing treatments and interventions, ordering and review of laboratory studies, ordering and review of radiographic studies, pulse  oximetry, re-evaluation of patient's condition, obtaining history from patient or surrogate and review of old charts   I assumed direction of critical care for this patient from another provider in my specialty: no     (including critical care time)  Medications Ordered in ED Medications  0.9 %  sodium chloride infusion (Manually program via Guardrails IV Fluids) (has no administration in time range)  lactated ringers infusion (has no administration in time range)  lactated ringers bolus 1,000 mL (1,000 mLs Intravenous New Bag/Given 02/05/20 0007)    And  lactated ringers bolus 500 mL (500 mLs Intravenous New Bag/Given 02/05/20 0023)    And  lactated ringers bolus 250 mL (250 mLs Intravenous New Bag/Given 02/05/20 0023)  ceFEPIme (MAXIPIME) 2 g in sodium chloride 0.9 % 100 mL IVPB (2 g Intravenous New Bag/Given 02/05/20 0018)  metroNIDAZOLE (FLAGYL) IVPB 500 mg (has no administration in time range)  vancomycin (VANCOCIN) IVPB 1000 mg/200 mL premix (has no administration in time range)  sodium chloride 0.9 % bolus 1,000 mL (0 mLs Intravenous Stopped 02/05/20 0010)    ED Course  I have reviewed the triage vital signs and the nursing notes.  Pertinent labs & imaging results that were available during my care of the patient were reviewed by me and considered in my medical decision making (see chart for details).  Clinical Course as of Feb 05 27  Thu Feb 04, 2020  2230 BP 80/50 upon my arrival to the room.  HR 126 and SPO2 82% on room air.     [HM]  Fri Feb 05, 2020  0026 Blood pressures continue to remain soft  BP(!): 92/56 [HM]  0026 Severe, symptomatic anemia.  Blood transfusion ordered.  Patient's husband consents.  Hemoglobin(!!): 6.2 [HM]  0027 Leukocytosis  WBC(!): 10.9 [HM]  0027 Acute kidney injury  Creatinine(!): 1.24 [HM]  0027 Elevated.  30 mL/kg fluid bolus ordered in addition to initial first liter.  Lactic Acid, Venous(!!): 3.0 [HM]    Clinical Course User Index [HM]  Seanpaul Preece, Gwenlyn Perking   MDM Rules/Calculators/A&P                           Patient presents with hypotension, hypoxia, tachycardia in the setting of bloody diarrhea.  Sepsis screening initiated.  Fluids initiated.  Hemoccult positive.    The patient was discussed with and seen by Dr. Regenia Skeeter who agrees with the treatment plan.  11:46 PM Patient with elevated lactic acid 3.0.  She remains with soft blood pressures even after initial fluid resuscitation with 1 L.  30 mL/kg ordered.  Rectal temp 99.9.  Hemoglobin 6.2.  AKI with creatinine of 1.24.  Event leukocytosis, elevated lactic acid, borderline fever, tachycardia, hypotension and hypoxia code sepsis was initiated and antibiotics ordered.    Even positive fecal occult and hypotension with globin of 6.2 down from 13.3 two years ago will transfuse.  Suspect GI bleed.  Patient will need admission.  12:24 AM Discussed patient's case with hospitalist, Dr. Maryland Pink.  I have recommended admission and patient (and family if present) agree with this plan. Admitting physician will place admission orders.    Final Clinical Impression(s) / ED Diagnoses Final diagnoses:  Rectal bleeding  Sepsis with acute renal failure and septic shock, due to  unspecified organism, unspecified acute renal failure type ([place])  Anemia, unspecified type    Rx / DC Orders ED Discharge Orders    None       Travers Goodley, Gwenlyn Perking 02/05/20 Beckey Rutter, MD 02/06/20 [phone]

## 2020-02-05 DIAGNOSIS — F03C0 Unspecified dementia, severe, without behavioral disturbance, psychotic disturbance, mood disturbance, and anxiety: Secondary | ICD-10-CM | POA: Diagnosis present

## 2020-02-05 DIAGNOSIS — I959 Hypotension, unspecified: Secondary | ICD-10-CM | POA: Diagnosis present

## 2020-02-05 DIAGNOSIS — R6521 Severe sepsis with septic shock: Secondary | ICD-10-CM

## 2020-02-05 DIAGNOSIS — Z9049 Acquired absence of other specified parts of digestive tract: Secondary | ICD-10-CM | POA: Diagnosis not present

## 2020-02-05 DIAGNOSIS — Z888 Allergy status to other drugs, medicaments and biological substances status: Secondary | ICD-10-CM | POA: Diagnosis not present

## 2020-02-05 DIAGNOSIS — M2550 Pain in unspecified joint: Secondary | ICD-10-CM | POA: Diagnosis not present

## 2020-02-05 DIAGNOSIS — Z823 Family history of stroke: Secondary | ICD-10-CM | POA: Diagnosis not present

## 2020-02-05 DIAGNOSIS — Z7952 Long term (current) use of systemic steroids: Secondary | ICD-10-CM | POA: Diagnosis not present

## 2020-02-05 DIAGNOSIS — D649 Anemia, unspecified: Secondary | ICD-10-CM | POA: Diagnosis not present

## 2020-02-05 DIAGNOSIS — D62 Acute posthemorrhagic anemia: Secondary | ICD-10-CM

## 2020-02-05 DIAGNOSIS — R0689 Other abnormalities of breathing: Secondary | ICD-10-CM | POA: Diagnosis not present

## 2020-02-05 DIAGNOSIS — Z886 Allergy status to analgesic agent status: Secondary | ICD-10-CM | POA: Diagnosis not present

## 2020-02-05 DIAGNOSIS — G2581 Restless legs syndrome: Secondary | ICD-10-CM | POA: Diagnosis present

## 2020-02-05 DIAGNOSIS — K921 Melena: Secondary | ICD-10-CM | POA: Diagnosis not present

## 2020-02-05 DIAGNOSIS — Z885 Allergy status to narcotic agent status: Secondary | ICD-10-CM | POA: Diagnosis not present

## 2020-02-05 DIAGNOSIS — Z20822 Contact with and (suspected) exposure to covid-19: Secondary | ICD-10-CM | POA: Diagnosis present

## 2020-02-05 DIAGNOSIS — R0902 Hypoxemia: Secondary | ICD-10-CM | POA: Diagnosis present

## 2020-02-05 DIAGNOSIS — R451 Restlessness and agitation: Secondary | ICD-10-CM | POA: Diagnosis not present

## 2020-02-05 DIAGNOSIS — F0390 Unspecified dementia without behavioral disturbance: Secondary | ICD-10-CM | POA: Diagnosis not present

## 2020-02-05 DIAGNOSIS — A419 Sepsis, unspecified organism: Secondary | ICD-10-CM | POA: Diagnosis present

## 2020-02-05 DIAGNOSIS — G47419 Narcolepsy without cataplexy: Secondary | ICD-10-CM | POA: Diagnosis present

## 2020-02-05 DIAGNOSIS — Z79899 Other long term (current) drug therapy: Secondary | ICD-10-CM | POA: Diagnosis not present

## 2020-02-05 DIAGNOSIS — Z515 Encounter for palliative care: Secondary | ICD-10-CM | POA: Diagnosis not present

## 2020-02-05 DIAGNOSIS — N179 Acute kidney failure, unspecified: Secondary | ICD-10-CM

## 2020-02-05 DIAGNOSIS — Z7189 Other specified counseling: Secondary | ICD-10-CM | POA: Diagnosis not present

## 2020-02-05 DIAGNOSIS — Z66 Do not resuscitate: Secondary | ICD-10-CM | POA: Diagnosis present

## 2020-02-05 DIAGNOSIS — R58 Hemorrhage, not elsewhere classified: Secondary | ICD-10-CM | POA: Diagnosis not present

## 2020-02-05 DIAGNOSIS — K625 Hemorrhage of anus and rectum: Secondary | ICD-10-CM | POA: Diagnosis not present

## 2020-02-05 DIAGNOSIS — Z7401 Bed confinement status: Secondary | ICD-10-CM | POA: Diagnosis not present

## 2020-02-05 DIAGNOSIS — F0391 Unspecified dementia with behavioral disturbance: Secondary | ICD-10-CM | POA: Diagnosis present

## 2020-02-05 DIAGNOSIS — Z96642 Presence of left artificial hip joint: Secondary | ICD-10-CM | POA: Diagnosis present

## 2020-02-05 LAB — CBC
HCT: 31.5 % — ABNORMAL LOW (ref 36.0–46.0)
Hemoglobin: 9.3 g/dL — ABNORMAL LOW (ref 12.0–15.0)
MCH: 22.6 pg — ABNORMAL LOW (ref 26.0–34.0)
MCHC: 29.5 g/dL — ABNORMAL LOW (ref 30.0–36.0)
MCV: 76.5 fL — ABNORMAL LOW (ref 80.0–100.0)
Platelets: 393 10*3/uL (ref 150–400)
RBC: 4.12 MIL/uL (ref 3.87–5.11)
RDW: 22.7 % — ABNORMAL HIGH (ref 11.5–15.5)
WBC: 12 10*3/uL — ABNORMAL HIGH (ref 4.0–10.5)
nRBC: 0.2 % (ref 0.0–0.2)

## 2020-02-05 LAB — I-STAT CHEM 8, ED
BUN: 40 mg/dL — ABNORMAL HIGH (ref 8–23)
Calcium, Ion: 1.23 mmol/L (ref 1.15–1.40)
Chloride: 111 mmol/L (ref 98–111)
Creatinine, Ser: 1.3 mg/dL — ABNORMAL HIGH (ref 0.44–1.00)
Glucose, Bld: 94 mg/dL (ref 70–99)
HCT: 19 % — ABNORMAL LOW (ref 36.0–46.0)
Hemoglobin: 6.5 g/dL — CL (ref 12.0–15.0)
Potassium: 4.3 mmol/L (ref 3.5–5.1)
Sodium: 143 mmol/L (ref 135–145)
TCO2: 19 mmol/L — ABNORMAL LOW (ref 22–32)

## 2020-02-05 LAB — SARS CORONAVIRUS 2 BY RT PCR (HOSPITAL ORDER, PERFORMED IN ~~LOC~~ HOSPITAL LAB): SARS Coronavirus 2: NEGATIVE

## 2020-02-05 LAB — PREPARE RBC (CROSSMATCH)

## 2020-02-05 LAB — LACTIC ACID, PLASMA: Lactic Acid, Venous: 3.1 mmol/L (ref 0.5–1.9)

## 2020-02-05 MED ORDER — SODIUM CHLORIDE 0.9 % IV SOLN: INTRAVENOUS | Status: DC

## 2020-02-05 MED ORDER — LACTATED RINGERS IV SOLN: INTRAVENOUS | Status: DC

## 2020-02-05 MED ORDER — ONDANSETRON HCL 4 MG/2ML IJ SOLN: 4.0000 mg | Freq: Four times a day (QID) | INTRAMUSCULAR | Status: DC | PRN

## 2020-02-05 MED ORDER — METRONIDAZOLE IN NACL 5-0.79 MG/ML-% IV SOLN
500.0000 mg | Freq: Three times a day (TID) | INTRAVENOUS | Status: DC
  Administered 2020-02-05 – 2020-02-07 (×6): 500 mg via INTRAVENOUS
  Filled 2020-02-05 (×7): qty 100

## 2020-02-05 MED ORDER — SODIUM CHLORIDE 0.9 % IV SOLN
2.0000 g | INTRAVENOUS | Status: DC
  Administered 2020-02-05 – 2020-02-07 (×3): 2 g via INTRAVENOUS
  Filled 2020-02-05 (×4): qty 20

## 2020-02-05 MED ORDER — SODIUM CHLORIDE 0.9 % IV SOLN
2.0000 g | Freq: Once | INTRAVENOUS | Status: AC
  Administered 2020-02-05: 2 g via INTRAVENOUS
  Filled 2020-02-05: qty 2

## 2020-02-05 MED ORDER — HALOPERIDOL LACTATE 5 MG/ML IJ SOLN: 2.0000 mg | Freq: Four times a day (QID) | INTRAMUSCULAR | Status: DC | PRN

## 2020-02-05 MED ORDER — LACTATED RINGERS IV BOLUS (SEPSIS)
1000.0000 mL | Freq: Once | INTRAVENOUS | Status: AC
  Administered 2020-02-05: 1000 mL via INTRAVENOUS

## 2020-02-05 MED ORDER — VANCOMYCIN HCL IN DEXTROSE 1-5 GM/200ML-% IV SOLN
1000.0000 mg | Freq: Once | INTRAVENOUS | Status: DC
  Filled 2020-02-05: qty 200

## 2020-02-05 MED ORDER — ONDANSETRON HCL 4 MG PO TABS: 4.0000 mg | ORAL_TABLET | Freq: Four times a day (QID) | ORAL | Status: DC | PRN

## 2020-02-05 MED ORDER — LACTATED RINGERS IV BOLUS (SEPSIS)
500.0000 mL | Freq: Once | INTRAVENOUS | Status: AC
  Administered 2020-02-05: 500 mL via INTRAVENOUS

## 2020-02-05 MED ORDER — HALOPERIDOL LACTATE 5 MG/ML IJ SOLN
2.0000 mg | INTRAMUSCULAR | Status: DC | PRN
  Administered 2020-02-06 – 2020-02-07 (×3): 2 mg via INTRAVENOUS
  Filled 2020-02-05 (×4): qty 1

## 2020-02-05 MED ORDER — MORPHINE SULFATE (PF) 2 MG/ML IV SOLN
2.0000 mg | INTRAVENOUS | Status: DC | PRN
  Administered 2020-02-06 – 2020-02-07 (×6): 2 mg via INTRAVENOUS
  Filled 2020-02-05 (×7): qty 1

## 2020-02-05 MED ORDER — METRONIDAZOLE IN NACL 5-0.79 MG/ML-% IV SOLN
500.0000 mg | Freq: Once | INTRAVENOUS | Status: AC
  Administered 2020-02-05: 500 mg via INTRAVENOUS
  Filled 2020-02-05: qty 100

## 2020-02-05 MED ORDER — LACTATED RINGERS IV BOLUS (SEPSIS)
250.0000 mL | Freq: Once | INTRAVENOUS | Status: AC
  Administered 2020-02-05: 250 mL via INTRAVENOUS

## 2020-02-05 NOTE — Progress Notes (Signed)
Palliative consult received. Chart reviewed. Visited patient at bedside. Will contact son in the morning.  Florentina Jenny, PA-C Palliative Medicine Office:  [phone]  No charge note.

## 2020-02-05 NOTE — ED Notes (Signed)
Daughter at bedside.

## 2020-02-05 NOTE — ED Notes (Signed)
hospitalist at bedside

## 2020-02-05 NOTE — Progress Notes (Signed)
[facility] Documentation  Pt was scheduled for an admission visit today for hospice care with [facility]. Son notified our triage nurse that pt was going to hospital.   Liaison will continue to follow to ensure safe dc plan.   Please do not hesitate to outreach with any questions.   Thank you,  Freddie Breech, RN  [facility] Liaison [phone]

## 2020-02-05 NOTE — ED Notes (Signed)
Breakfast ordered--Delayni Streed  

## 2020-02-05 NOTE — ED Notes (Signed)
Lunch Tray Ordered @ 1019.

## 2020-02-05 NOTE — Progress Notes (Signed)
PROGRESS NOTE    Stacey Clay  WUJ:[id number] DOB: [birth year] DOA: 02/04/2020 PCP: Gayland Curry, DO    Brief Narrative:  86 y.o. female with a past medical history of advanced dementia who is taken care of by her son.  No history was available from the patient due to her dementia.  Most of the information was obtained from ED provider and the patient's son.  Patient apparently was in her usual state of health till about 830 to 9 PM last night when she had a large bowel movement which was quite bloody.  Apparently he saw some dark stool as well.  Patient's son denies any black-colored stools recently.  No bloody emesis has been noted.  Patient does not take any anticoagulation.  She has not been taking any NSAIDs.  Patient keeps yelling out for help but unable to describe her symptoms.  She states that she does not have any pain.  Son does not feel that the patient has any significant pain.  Once again history is quite limited.  In the emergency department she was found to have a hemoglobin of 6.2.  Her previous hemoglobin was 13 from about 2 years ago.  She has GI bleed possibly lower GI in origin.  She does have abdominal tenderness.   Assessment & Plan:   Principal Problem:   Hematochezia Active Problems:   Acute blood loss anemia   Advanced dementia ([place])   1.  Goals of care:  On presentation, was discussed in detail with patient's son.   - Ideally we would like to do CT scans but the patient is not a candidate for any kind of interventions procedures or surgeries due to her advanced dementia.   -Family reports that the main goal is for her to be comfortable.   -Patient would be agreeable with blood transfusion and antibiotics and IV fluids.  -No plan for escalation of care -Palliative care consulted at time of admission  2.  Hematochezia:  -Patient with acute blood loss anemia per below, receiving packed red blood cells at time of presentation -Patient not a candidate for  further work-up of the same due to her advanced dementia.  Supportive care  3.  Acute blood loss anemia:  -Family had agreed upon blood transfusion at time of presentation. -At time of presentation, agreement was for not transfusing beyond 2 units already ordered  4.  Advanced dementia with behavioral disturbances:  -She will be given Haldol for her agitation.  May need to utilize Ativan as well.  5. Concern for intra-abdominal infection/sepsis present on admission: Patient was noted to be tachycardic, tachypneic with elevated WBC and low blood pressures.   -Her abdomen is tender to palpation.  - However as noted above patient not a candidate for aggressive interventions.   -Patient is continued on ceftriaxone and metronidazole for now.   -No escalation of care.   -Continue with IV fluids as well.  -Lactate over 3 noted, improved with hydration reviewed  6. Acute renal failure:  -Continue to hydrate.  Monitor urine output.  DVT prophylaxis: SCD's Code Status: DNR Family Communication: Pt in room, family not at bedside this AM  Status is: Inpatient  Remains inpatient appropriate because:Unsafe d/c plan, IV treatments appropriate due to intensity of illness or inability to take PO and Inpatient level of care appropriate due to severity of illness   Dispo: The patient is from: Home              Anticipated  d/c is to: Unclear at this time given critical illness              Anticipated d/c date is: 2 days              Patient currently is not medically stable to d/c.       Consultants:   Palliative care  Procedures:     Antimicrobials: Anti-infectives (From admission, onward)   Start     Dose/Rate Route Frequency Ordered Stop   02/05/20 0800  cefTRIAXone (ROCEPHIN) 2 g in sodium chloride 0.9 % 100 mL IVPB     Discontinue     2 g 200 mL/hr over 30 Minutes Intravenous Every 24 hours 02/05/20 0048     02/05/20 0800  metroNIDAZOLE (FLAGYL) IVPB 500 mg     Discontinue      500 mg 100 mL/hr over 60 Minutes Intravenous Every 8 hours 02/05/20 0048     02/05/20 0015  ceFEPIme (MAXIPIME) 2 g in sodium chloride 0.9 % 100 mL IVPB        2 g 200 mL/hr over 30 Minutes Intravenous  Once 02/05/20 0000 02/05/20 0052   02/05/20 0015  metroNIDAZOLE (FLAGYL) IVPB 500 mg        500 mg 100 mL/hr over 60 Minutes Intravenous  Once 02/05/20 0000 02/05/20 0134   02/05/20 0015  vancomycin (VANCOCIN) IVPB 1000 mg/200 mL premix  Status:  Discontinued        1,000 mg 200 mL/hr over 60 Minutes Intravenous  Once 02/05/20 0000 02/05/20 0048       Subjective: Unable to assess given current mentation  Objective: Vitals:   02/05/20 1200 02/05/20 1255 02/05/20 1326 02/05/20 1628  BP: 118/82 115/79 100/77 (!) 79/59  Pulse: 59 72 72 (!) 106  Resp:  20 21 20   Temp:  (!) 97.4 F (36.3 C) 98.3 F (36.8 C) 98.6 F (37 C)  TempSrc:  Axillary    SpO2: 97% 99% (!) 81% 100%  Weight:      Height:        Intake/Output Summary (Last 24 hours) at 02/05/2020 1642 Last data filed at 02/05/2020 0840 Gross per 24 hour  Intake 915 ml  Output --  Net 915 ml   Filed Weights   02/05/20 0043  Weight: 52.2 kg    Examination:  General exam: Appears calm, laying in fetal position appears very drowsy Respiratory system: Clear to auscultation. Respiratory effort normal. Cardiovascular system: S1 & S2 heard, regular Gastrointestinal system: Abdomen is nondistended, positive bowel sounds Central nervous system: Alert and oriented. No focal neurological deficits. Extremities: Symmetric 5 x 5 power. Skin: No rashes, lesions  Psychiatry: Able to assess given current mentation, patient very drowsy  Data Reviewed: I have personally reviewed following labs and imaging studies  CBC: Recent Labs  Lab 02/04/20 2230 02/05/20 0017 02/05/20 1233  WBC 10.9*  --  12.0*  NEUTROABS 10.0*  --   --   HGB 6.2* 6.5* 9.3*  HCT 23.8* 19.0* 31.5*  MCV 72.1*  --  76.5*  PLT 529*  --  465   Basic  Metabolic Panel: Recent Labs  Lab 02/04/20 2230 02/05/20 0017  NA 144 143  K 4.4 4.3  CL 114* 111  CO2 20*  --   GLUCOSE 94 94  BUN 43* 40*  CREATININE 1.24* 1.30*  CALCIUM 8.6*  --    GFR: Estimated Creatinine Clearance: 21.3 mL/min (A) (by C-G formula based on SCr of 1.3 mg/dL (H)).  Liver Function Tests: Recent Labs  Lab 02/04/20 2230  AST 20  ALT 15  ALKPHOS 64  BILITOT 0.5  PROT 5.3*  ALBUMIN 2.2*   No results for input(s): LIPASE, AMYLASE in the last 168 hours. No results for input(s): AMMONIA in the last 168 hours. Coagulation Profile: Recent Labs  Lab 02/04/20 2230  INR 1.2   Cardiac Enzymes: No results for input(s): CKTOTAL, CKMB, CKMBINDEX, TROPONINI in the last 168 hours. BNP (last 3 results) No results for input(s): PROBNP in the last 8760 hours. HbA1C: No results for input(s): HGBA1C in the last 72 hours. CBG: No results for input(s): GLUCAP in the last 168 hours. Lipid Profile: No results for input(s): CHOL, HDL, LDLCALC, TRIG, CHOLHDL, LDLDIRECT in the last 72 hours. Thyroid Function Tests: No results for input(s): TSH, T4TOTAL, FREET4, T3FREE, THYROIDAB in the last 72 hours. Anemia Panel: No results for input(s): VITAMINB12, FOLATE, FERRITIN, TIBC, IRON, RETICCTPCT in the last 72 hours. Sepsis Labs: Recent Labs  Lab 02/04/20 2227 02/05/20 0010  LATICACIDVEN 3.0* 3.1*    Recent Results (from the past 240 hour(s))  SARS Coronavirus 2 by RT PCR (hospital order, performed in [facility] hospital lab) Nasopharyngeal Nasopharyngeal Swab     Status: None   Collection Time: 02/05/20 12:47 AM   Specimen: Nasopharyngeal Swab  Result Value Ref Range Status   SARS Coronavirus 2 NEGATIVE NEGATIVE Final    Comment: (NOTE) SARS-CoV-2 target nucleic acids are NOT DETECTED.  The SARS-CoV-2 RNA is generally detectable in upper and lower respiratory specimens during the acute phase of infection. The lowest concentration of SARS-CoV-2 viral copies this  assay can detect is 250 copies / mL. A negative result does not preclude SARS-CoV-2 infection and should not be used as the sole basis for treatment or other patient management decisions.  A negative result may occur with improper specimen collection / handling, submission of specimen other than nasopharyngeal swab, presence of viral mutation(s) within the areas targeted by this assay, and inadequate number of viral copies (<250 copies / mL). A negative result must be combined with clinical observations, patient history, and epidemiological information.  Fact Sheet for Patients:   StrictlyIdeas.no  Fact Sheet for Healthcare Providers: BankingDealers.co.za  This test is not yet approved or  cleared by the [place] FDA and has been authorized for detection and/or diagnosis of SARS-CoV-2 by FDA under an Emergency Use Authorization (EUA).  This EUA will remain in effect (meaning this test can be used) for the duration of the COVID-19 declaration under Section 564(b)(1) of the Act, 21 U.S.C. section 360bbb-3(b)(1), unless the authorization is terminated or revoked sooner.  Performed at [facility], [place] [address], [place], [place] [address]      Radiology Studies: DG Chest Port 1 View  Result Date: 02/04/2020 CLINICAL DATA:  Questionable sepsis.  Blood in stool.  Lethargy. EXAM: PORTABLE CHEST 1 VIEW COMPARISON:  None. FINDINGS: Heart is normal in size. Normal mediastinal contours. There is ill-defined right paratracheal soft tissue density. No confluent airspace disease. No pneumothorax or pleural effusion. No pulmonary edema. Advanced right shoulder osteoarthritis. Scoliotic curvature in the spine. Bones are under mineralized. IMPRESSION: Ill-defined right paratracheal soft tissue density. This may be overlapping vascular structures, however cannot exclude underlying adenopathy or mass. Electronically Signed   By: Keith Rake  M.D.   On: 02/04/2020 22:48    Scheduled Meds: . sodium chloride   Intravenous Once   Continuous Infusions: . sodium chloride 100 mL/hr at 02/05/20  1325  . cefTRIAXone (ROCEPHIN)  IV Stopped (02/05/20 1040)  . metronidazole Stopped (02/05/20 1200)     LOS: 0 days   Marylu Lund, MD [facility] Pager On Amion  If 7PM-7AM, please contact night-coverage 02/05/2020, 4:42 PM

## 2020-02-05 NOTE — H&P (Signed)
[facility] History and Physical  Stacey Clay UEA:[id number] DOB: 02/01/1923 DOA: 02/04/2020   PCP: Gayland Curry, DO  Specialists: None  Chief Complaint: Rectal bleeding  HPI: Stacey Clay is a 86 y.o. female with a past medical history of advanced dementia who is taken care of by her son.  No history was available from the patient due to her dementia.  Most of the information was obtained from ED provider and the patient's son.  Patient apparently was in her usual state of health till about 830 to 9 PM last night when she had a large bowel movement which was quite bloody.  Apparently he saw some dark stool as well.  Patient's son denies any black-colored stools recently.  No bloody emesis has been noted.  Patient does not take any anticoagulation.  She has not been taking any NSAIDs.  Patient keeps yelling out for help but unable to describe her symptoms.  She states that she does not have any pain.  Son does not feel that the patient has any significant pain.  Once again history is quite limited.  In the emergency department she was found to have a hemoglobin of 6.2.  Her previous hemoglobin was 13 from about 2 years ago.  She has GI bleed possibly lower GI in origin.  She does have abdominal tenderness.  Home Medications: Prior to Admission medications   Medication Sig Start Date End Date Taking? Authorizing Provider  diclofenac sodium (VOLTAREN) 1 % GEL APPLY 2 GRAMS TOPICALLY FOUR TIMES A DAY 08/04/18   Reed, Tiffany L, DO  meclizine (ANTIVERT) 12.5 MG tablet Take 1 tablet (12.5 mg total) by mouth daily as needed for dizziness. 10/25/16   Reed, Tiffany L, DO  Multiple Vitamin (MULTIVITAMIN) tablet Take 1 tablet by mouth daily.    [provider]  predniSONE (DELTASONE) 20 MG tablet Take 3 tablets (60 mg total) by mouth daily with breakfast. For rash 12/11/17   Gildardo Cranker, DO  pregabalin (LYRICA) 50 MG capsule Take 1 capsule (50 mg total) by mouth 2 (two) times daily.  For itching 11/14/17   Reed, Tiffany L, DO    Allergies:  Allergies  Allergen Reactions  . Aleve [Naproxen Sodium]     Stomach bleeding  . Aspirin     Stomach bleeding  . Darvocet [Propoxyphene N-Acetaminophen] Nausea And Vomiting  . Darvon [Propoxyphene Hcl] Nausea And Vomiting  . Lomotil [Diphenoxylate] Nausea And Vomiting  . Ultram [Tramadol] Nausea And Vomiting    Past Medical History: Past Medical History:  Diagnosis Date  . Arthritis    Osteoarthritis -knees, hips-"pseudo gout right knee" "curvature of spine"  . Complication of anesthesia   . Contracture of lower leg joint   . Disorder of bone and cartilage, unspecified   . Disturbance of skin sensation   . Gait instability    due to contractures and curvature to spine uses-walker to ambulate  . Hypoglycemia   . Hypoglycemia, unspecified   . Pain in joint, lower leg   . Pain in joint, pelvic region and thigh   . PONV (postoperative nausea and vomiting)   . Restless legs syndrome (RLS)   . 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 radioactive Iodine s/p yrs after having thyroid goiter surgery  . Unspecified cataract    not surgically ready    Past Surgical History:  Procedure Laterality Date  . CHOLECYSTECTOMY    .  goiter resection  1958  . TONSILLECTOMY  1932  . TOTAL HIP ARTHROPLASTY Left 11/28/2012   Procedure: LEFT TOTAL HIP ARTHROPLASTY ANTERIOR APPROACH;  Surgeon: Mcarthur Rossetti, MD;  Location: [facility];  Service: Orthopedics;  Laterality: Left;    Social History: Lives with the son.  No other information is available.  Family History:  Family History  Problem Relation Age of Onset  . Stroke Mother   . Stroke Father      Review of Systems -unable to do due to her dementia  Physical Examination  Vitals:   02/05/20 0010 02/05/20 0018 02/05/20 0036 02/05/20 0043  BP: (!) 92/56     Pulse: (!) 116 (!) 112    Resp: 21 12      Temp:   98 F (36.7 C)   TempSrc:   Oral   SpO2: 99% 99%    Weight:    52.2 kg  Height:    5\' 5"  (1.651 m)    BP (!) 92/56   Pulse (!) 112   Temp 98 F (36.7 C) (Oral)   Resp 12   Ht 5\' 5"  (1.651 m)   Wt 52.2 kg   SpO2 99%   BMI 19.14 kg/m   General appearance: alert, delirious and distracted Head: Normocephalic, without obvious abnormality, atraumatic Eyes: conjunctivae/corneas clear. PERRL, EOM's intact.  Throat: Dry mucous membranes Neck: no adenopathy, no carotid bruit, no JVD, supple, symmetrical, trachea midline and thyroid not enlarged, symmetric, no tenderness/mass/nodules Resp: Diminished air entry at the bases.  No definite crackles wheezing or rhonchi.  Normal effort at rest. Cardio: S1-S2 is tachycardic regular.  No S3-S4.  No rubs or bruit. GI: Abdomen is noted to be slightly distended.  She does cry out when I palpate.  No obvious masses appreciated.  Examination was limited as the patient started pushing me away. Extremities: extremities normal, atraumatic, no cyanosis or edema Pulses: 2+ and symmetric Skin: Skin color, texture, turgor normal. No rashes or lesions Lymph nodes: Cervical, supraclavicular, and axillary nodes normal. Neurologic: Agitated.  Disoriented.  Moving all her extremities.  No obvious focal deficits noted.    Labs on Admission: I have personally reviewed following labs and imaging studies  CBC: Recent Labs  Lab 02/04/20 2230 02/05/20 0017  WBC 10.9*  --   NEUTROABS 10.0*  --   HGB 6.2* 6.5*  HCT 23.8* 19.0*  MCV 72.1*  --   PLT 529*  --    Basic Metabolic Panel: Recent Labs  Lab 02/04/20 2230 02/05/20 0017  NA 144 143  K 4.4 4.3  CL 114* 111  CO2 20*  --   GLUCOSE 94 94  BUN 43* 40*  CREATININE 1.24* 1.30*  CALCIUM 8.6*  --    GFR: Estimated Creatinine Clearance: 21.3 mL/min (A) (by C-G formula based on SCr of 1.3 mg/dL (H)). Liver Function Tests: Recent Labs  Lab 02/04/20 2230  AST 20  ALT 15  ALKPHOS 64   BILITOT 0.5  PROT 5.3*  ALBUMIN 2.2*   Coagulation Profile: Recent Labs  Lab 02/04/20 2230  INR 1.2     Radiological Exams on Admission: DG Chest Port 1 View  Result Date: 02/04/2020 CLINICAL DATA:  Questionable sepsis.  Blood in stool.  Lethargy. EXAM: PORTABLE CHEST 1 VIEW COMPARISON:  None. FINDINGS: Heart is normal in size. Normal mediastinal contours. There is ill-defined right paratracheal soft tissue density. No confluent airspace disease. No pneumothorax or pleural effusion. No pulmonary edema. Advanced right shoulder osteoarthritis. Scoliotic  curvature in the spine. Bones are under mineralized. IMPRESSION: Ill-defined right paratracheal soft tissue density. This may be overlapping vascular structures, however cannot exclude underlying adenopathy or mass. Electronically Signed   By: Keith Rake M.D.   On: 02/04/2020 22:48    My interpretation of Electrocardiogram: Sinus tachycardia 123 bpm.  No obvious ischemic changes.  Computer has incorrectly interpreted this as atrial fibrillation.   Problem List  Principal Problem:   Hematochezia Active Problems:   Acute blood loss anemia   Advanced dementia ([place])   Assessment: This is a 86 year old female with advanced dementia who has been progressively getting worse and has become bedbound over the last couple of weeks, comes in with what appears to be lower GI bleed.  She has acute blood loss anemia.  Plan:  1.  Goals of care: This was discussed in detail with patient's son.  He tells me that she has been progressively getting worse and has become bedbound over the last couple of weeks.  He had actually started working on getting hospice involved but that has not happened yet.  He was told that patient could have an acute intra-abdominal process.  Her abdomen is tender to palpation.  Ideally we would like to do CT scans but the patient is not a candidate for any kind of interventions procedures or surgeries due to her  advanced dementia.  He agrees.  His main goal is for her to be comfortable.  He however wants to continue with blood transfusion and antibiotics and IV fluids.  This is reasonable to do so at this time.  However if she does not improve or if she declines we will not escalate care.  We will consult palliative care for further discussions of goals of care as well as helping with disposition.  2.  Hematochezia: Differential diagnosis is broad to include bleeding from diverticulosis, cancer, vascular etiology.  Patient not a candidate for further work-up of the same due to her advanced dementia.  3.  Acute blood loss anemia: Patient son would like for blood transfusion which will be continued.  But he agrees with not transfusing beyond the 2 units already ordered.  4.  Advanced dementia with behavioral disturbances: She will be given Haldol for her agitation.  May need to utilize Ativan as well.  5. Concern for intra-abdominal infection/sepsis present on admission: Patient was noted to be tachycardic, tachypneic with elevated WBC and low blood pressures.  Her abdomen is tender to palpation.  However as noted above patient not a candidate for aggressive interventions.  It is reasonable to continue with ceftriaxone and metronidazole for now.  No escalation of care.  Continue with IV fluids as well.  Lactic acid level was 3.0.  Remains 3.1.  Will not check it any further.  6. Acute renal failure: Continue to hydrate.  Monitor urine output.   DVT Prophylaxis: No heparin products due to bleeding.  Since we are heading towards more of comfort approach we will hold off on SCDs as well Code Status: DNR/DNI Family Communication: Discussed with patient's son Disposition: Unclear at this time Consults called: Palliative care Admission Status: Status is: Inpatient  Remains inpatient appropriate because:IV treatments appropriate due to intensity of illness or inability to take PO and Inpatient level of care  appropriate due to severity of illness   Dispo: The patient is from: Home              Anticipated d/c is to: To be determined  Anticipated d/c date is: 2 days              Patient currently is not medically stable to d/c.    Severity of Illness: The appropriate patient status for this patient is INPATIENT. Inpatient status is judged to be reasonable and necessary in order to provide the required intensity of service to ensure the patient's safety. The patient's presenting symptoms, physical exam findings, and initial radiographic and laboratory data in the context of their chronic comorbidities is felt to place them at high risk for further clinical deterioration. Furthermore, it is not anticipated that the patient will be medically stable for discharge from the hospital within 2 midnights of admission. The following factors support the patient status of inpatient.   " The patient's presenting symptoms include rectal bleeding. " The worrisome physical exam findings include abdominal tenderness. " The initial radiographic and laboratory data are worrisome because of acute blood loss anemia. " The chronic co-morbidities include advanced dementia.   * I certify that at the point of admission it is my clinical judgment that the patient will require inpatient hospital care spanning beyond 2 midnights from the point of admission due to high intensity of service, high risk for further deterioration and high frequency of surveillance required.*    Further management decisions will depend on results of further testing and patient's response to treatment.   Rorey Hodges Clay Schwab  [facility] operational officer on [email or web address]  02/05/2020, 12:48 AM

## 2020-02-06 DIAGNOSIS — Z7189 Other specified counseling: Secondary | ICD-10-CM

## 2020-02-06 DIAGNOSIS — Z515 Encounter for palliative care: Secondary | ICD-10-CM

## 2020-02-06 LAB — CBC
HCT: 28.1 % — ABNORMAL LOW (ref 36.0–46.0)
Hemoglobin: 8.2 g/dL — ABNORMAL LOW (ref 12.0–15.0)
MCH: 22.1 pg — ABNORMAL LOW (ref 26.0–34.0)
MCHC: 29.2 g/dL — ABNORMAL LOW (ref 30.0–36.0)
MCV: 75.7 fL — ABNORMAL LOW (ref 80.0–100.0)
Platelets: 376 10*3/uL (ref 150–400)
RBC: 3.71 MIL/uL — ABNORMAL LOW (ref 3.87–5.11)
RDW: 22.9 % — ABNORMAL HIGH (ref 11.5–15.5)
WBC: 11.6 10*3/uL — ABNORMAL HIGH (ref 4.0–10.5)
nRBC: 0 % (ref 0.0–0.2)

## 2020-02-06 LAB — URINALYSIS, ROUTINE W REFLEX MICROSCOPIC
Bilirubin Urine: NEGATIVE
Glucose, UA: NEGATIVE mg/dL
Hgb urine dipstick: NEGATIVE
Ketones, ur: NEGATIVE mg/dL
Leukocytes,Ua: NEGATIVE
Nitrite: NEGATIVE
Protein, ur: 30 mg/dL — AB
Specific Gravity, Urine: 1.02 (ref 1.005–1.030)
pH: 5 (ref 5.0–8.0)

## 2020-02-06 LAB — COMPREHENSIVE METABOLIC PANEL
ALT: 13 U/L (ref 0–44)
AST: 21 U/L (ref 15–41)
Albumin: 2 g/dL — ABNORMAL LOW (ref 3.5–5.0)
Alkaline Phosphatase: 65 U/L (ref 38–126)
Anion gap: 10 (ref 5–15)
BUN: 48 mg/dL — ABNORMAL HIGH (ref 8–23)
CO2: 19 mmol/L — ABNORMAL LOW (ref 22–32)
Calcium: 8.1 mg/dL — ABNORMAL LOW (ref 8.9–10.3)
Chloride: 115 mmol/L — ABNORMAL HIGH (ref 98–111)
Creatinine, Ser: 0.91 mg/dL (ref 0.44–1.00)
GFR calc Af Amer: >60 mL/min (ref >60)
GFR calc non Af Amer: 54 mL/min — ABNORMAL LOW (ref >60)
Glucose, Bld: 102 mg/dL — ABNORMAL HIGH (ref 70–99)
Potassium: 3.9 mmol/L (ref 3.5–5.1)
Sodium: 144 mmol/L (ref 135–145)
Total Bilirubin: 0.3 mg/dL (ref 0.3–1.2)
Total Protein: 4.6 g/dL — ABNORMAL LOW (ref 6.5–8.1)

## 2020-02-06 LAB — TYPE AND SCREEN
ABO/RH(D): A POS
Antibody Screen: NEGATIVE
Unit division: 0
Unit division: 0

## 2020-02-06 LAB — BPAM RBC
Blood Product Expiration Date: 202108232359
Blood Product Expiration Date: 202108232359
ISSUE DATE / TIME: 202107300142
ISSUE DATE / TIME: 202107300549
Unit Type and Rh: 6200
Unit Type and Rh: 6200

## 2020-02-06 MED ORDER — POLYETHYLENE GLYCOL 3350 17 G PO PACK
17.0000 g | PACK | Freq: Every day | ORAL | Status: DC
  Administered 2020-02-06: 17 g via ORAL

## 2020-02-06 NOTE — Care Management (Addendum)
Notified by [facility] that they are following. Could not reach son at number listed. LVM for [facility] liaison, Bevely Palmer to call back.

## 2020-02-06 NOTE — Significant Event (Signed)
HOSPITAL MEDICINE OVERNIGHT EVENT NOTE  Notified by nursing that patient has had minimal urine output this shift.  Bladder scan revealing 319ml in the bladder.   Patient occasionally crying out , seemingly uncomfortable.   Chart reviewed.  Per daytime provider focus is to be on comfort.  I have advised an intermittent cath x 1 to try and achieve some relief for this patient.  If she is still experiencing discomfort/pain then I advised nursing to give the PRN morphine already ordered.  Sherryll Burger Marshal Schrecengost

## 2020-02-06 NOTE — Consult Note (Addendum)
Consultation Note Date: 02/06/2020   Patient Name: Stacey Clay  DOB: Oct 11, 1922  MRN: [id number]  Age / Sex: 86 y.o., female  PCP: Gayland Curry, DO Referring Physician: Donne Hazel, MD  Reason for Consultation: Establishing goals of care and Hospice Evaluation  HPI/Patient Profile: 86 y.o. female  with past medical history of advanced dementia, arthritis, pseudogout, hypoglycemia, thyroid toxicosis, restless leg syndrome who was admitted on 02/04/2020 with rectal bleeding and a hemoglobin of 6.2.  Her admitting history was obtained from her son.  It was noted that she has become bedbound over the last couple of weeks and her eating is very poor.  Son was working on involving hospice care at home prior to bringing her to the hospital.  Since admission she has received 2 units of blood and her hemoglobin has climbed appropriately.  RN noted no bowel movements or bleeding overnight..   Clinical Assessment and Goals of Care:  I have reviewed medical records including EPIC notes, labs and imaging, received report from the bedside nurse, examined the patient and called her son Macala Baldonado to discuss diagnosis prognosis, [place], EOL wishes, disposition and options.    I introduced Palliative Medicine as specialized medical care for people living with serious illness. It focuses on providing relief from the symptoms and stress of a serious illness.   As far as functional and nutritional status Mrs. Dykes has been declining recently.  Her PO intake has decreased significantly.  She used to be able to get out of bed to go to the bathroom but about 10 days ago she stopped and has been bed bound since.  We discussed her current illness and what it means in the larger context of her on-going co-morbidities.  Natural disease trajectory and expectations at EOL were discussed.  I attempted to elicit values and goals of care  important to the patient.  Clair Gulling and I talked about what would happen if his mother started to bleed again this hospitalization.  Clair Gulling felt that if she bled again it would be indicative of a serious problem.  Since she is not a candidate for procedures or surgery -  we should focus on her comfort at that point.  No further blood transfusions.  Clair Gulling stated that is she lived another 6 months and then bled again he would reconsider blood transfusion at that time.  Hospice and Palliative Care services outpatient were explained and offered.  Questions and concerns were addressed.  The family was encouraged to call with questions or concerns.        Primary Decision Maker:  NEXT OF KIN son Kenlyn Lose    SUMMARY OF RECOMMENDATIONS    Supportive care only No further blood transfusions this hospitalization Home with [facility] hospice when appropriate. Would add miralax daily to decrease constipation or pressure in the GI tract.   Understand she may have some additional stools with dark blood but is likely not bleeding unless her hgb drops.  Code Status/Advance Care Planning:  DNR  Symptom  Management:   Appears comfortable.  Additional Recommendations (Limitations, Scope, Preferences):  No Blood Transfusions and No Surgical Procedures  Palliative Prophylaxis:   Delirium Protocol  Prognosis:   Less than 6 months given rapid decline bed bound status, decreased PO intake, rectal bleeding in the setting of advanced dementia.  Discharge Planning: Home with Hospice when bed in place.      Primary Diagnoses: Present on Admission: . Hematochezia . Advanced dementia ([place])   I have reviewed the medical record, interviewed the patient and family, and examined the patient. The following aspects are pertinent.  Past Medical History:  Diagnosis Date  . Arthritis    Osteoarthritis -knees, hips-"pseudo gout right knee" "curvature of spine"  . Complication of anesthesia   . Contracture of lower  leg joint   . Disorder of bone and cartilage, unspecified   . Disturbance of skin sensation   . Gait instability    due to contractures and curvature to spine uses-walker to ambulate  . Hypoglycemia   . Hypoglycemia, unspecified   . Pain in joint, lower leg   . Pain in joint, pelvic region and thigh   . PONV (postoperative nausea and vomiting)   . Restless legs syndrome (RLS)   . 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 radioactive Iodine s/p yrs after having thyroid goiter surgery  . Unspecified cataract    not surgically ready   Social History   Socioeconomic History  . Marital status: Widowed    Spouse name: Not on file  . Number of children: Not on file  . Years of education: Not on file  . Highest education level: Not on file  Occupational History  . Not on file  Tobacco Use  . Smoking status: Never Smoker  . Smokeless tobacco: Never Used  Substance and Sexual Activity  . Alcohol use: No    Alcohol/week: 0.0 standard drinks  . Drug use: No  . Sexual activity: Never  Other Topics Concern  . Not on file  Social History Narrative   Widowed. Lives alone   No pets   A moderate amount of caffeine beverages daily   No specific diet      Occupation: Scientist, water quality   Social Determinants of Radio broadcast assistant Strain:   . Difficulty of Paying Living Expenses:   Food Insecurity:   . Worried About Charity fundraiser in the Last Year:   . Arboriculturist in the Last Year:   Transportation Needs:   . Film/video editor (Medical):   Marland Kitchen Lack of Transportation (Non-Medical):   Physical Activity:   . Days of Exercise per Week:   . Minutes of Exercise per Session:   Stress:   . Feeling of Stress :   Social Connections:   . Frequency of Communication with Friends and Family:   . Frequency of Social Gatherings with Friends and Family:   . Attends Religious Services:   . Active Member  of Clubs or Organizations:   . Attends Archivist Meetings:   Marland Kitchen Marital Status:    Family History  Problem Relation Age of Onset  . Stroke Mother   . Stroke Father     Allergies  Allergen Reactions  . Aleve [Naproxen Sodium]     Stomach bleeding  . Aspirin     Stomach bleeding  . Darvocet [Propoxyphene N-Acetaminophen] Nausea And Vomiting  . Darvon [Propoxyphene Hcl]  Nausea And Vomiting  . Lomotil [Diphenoxylate] Nausea And Vomiting  . Ultram [Tramadol] Nausea And Vomiting      Vital Signs: BP (!) 123/56 (BP Location: Right Arm)   Pulse 91   Temp 97.6 F (36.4 C) (Axillary)   Resp 19   Ht 5\' 5"  (1.651 m)   Wt 52.2 kg   SpO2 100%   BMI 19.14 kg/m  Pain Scale: PAINAD   Pain Score: Asleep   SpO2: SpO2: 100 % O2 Device:SpO2: 100 % O2 Flow Rate: .O2 Flow Rate (L/min): 2 L/min    Palliative Assessment/Data:  20%     Time In: 11:00 Time Out: 12:00 Time Total: 60 min. Visit consisted of counseling and education dealing with the complex and emotionally intense issues surrounding the need for palliative care and symptom management in the setting of serious and potentially life-threatening illness. Greater than 50%  of this time was spent counseling and coordinating care related to the above assessment and plan.  Signed by: Florentina Jenny, PA-C Palliative Medicine  Please contact Palliative Medicine Team phone at [phone] for questions and concerns.  For individual provider: See Shea Evans

## 2020-02-06 NOTE — Progress Notes (Signed)
PROGRESS NOTE    Stacey Clay  WVP:[id number] DOB: December 29, 1922 DOA: 02/04/2020 PCP: Gayland Curry, DO    Brief Narrative:  86 y.o. female with a past medical history of advanced dementia who is taken care of by her son.  No history was available from the patient due to her dementia.  Most of the information was obtained from ED provider and the patient's son.  Patient apparently was in her usual state of health till about 830 to 9 PM last night when she had a large bowel movement which was quite bloody.  Apparently he saw some dark stool as well.  Patient's son denies any black-colored stools recently.  No bloody emesis has been noted.  Patient does not take any anticoagulation.  She has not been taking any NSAIDs.  Patient keeps yelling out for help but unable to describe her symptoms.  She states that she does not have any pain.  Son does not feel that the patient has any significant pain.  Once again history is quite limited.  In the emergency department she was found to have a hemoglobin of 6.2.  Her previous hemoglobin was 13 from about 2 years ago.  She has GI bleed possibly lower GI in origin.  She does have abdominal tenderness.   Assessment & Plan:   Principal Problem:   Hematochezia Active Problems:   Acute blood loss anemia   Advanced dementia ([place])   Encounter for hospice care discussion   Palliative care encounter   1.  Goals of care:  On presentation, was discussed in detail with patient's son.   - Ideally we would like to do CT scans but the patient is not a candidate for any kind of interventions procedures or surgeries due to her advanced dementia.   -Family reports that the main goal is for her to be comfortable.   -Patient would be agreeable with blood transfusion and antibiotics and IV fluids.  -No plan for escalation of care -Palliative care is following, appreciated input.  Plan for discharge to home with hospice.  Anticipate discharge on 8/1 after necessary  equipment liver to patient's house  2.  Hematochezia:  -Patient with acute blood loss anemia per below, receiving packed red blood cells at time of presentation -Patient not a candidate for further work-up of the same due to her advanced dementia.  Continue with supportive care  3.  Acute blood loss anemia:  -Family had agreed upon blood transfusion at time of presentation. -At time of presentation, agreement was for not transfusing beyond 2 units that was already ordered -Hemoglobin this morning 8.2  4.  Advanced dementia with behavioral disturbances:  -She will be given Haldol for her agitation.  May need to utilize Ativan as well.  5. Concern for intra-abdominal infection/sepsis present on admission: Patient was noted to be tachycardic, tachypneic with elevated WBC and low blood pressures.   -Her abdomen is tender to palpation.  - However as noted above patient not a candidate for aggressive interventions.   -Patient is continued on ceftriaxone and metronidazole for now.   -No escalation of care.   -Continue with IV fluids as well.  -Lactate over 3 noted, improved with hydration reviewed -Likely transition to oral antibiotics at time of discharge  6. Acute renal failure:  -Patient was continued on IV hydration  DVT prophylaxis: SCD's Code Status: DNR Family Communication: Pt in room, family not at bedside this AM  Status is: Inpatient  Remains inpatient appropriate because:Unsafe d/c  plan, IV treatments appropriate due to intensity of illness or inability to take PO and Inpatient level of care appropriate due to severity of illness   Dispo: The patient is from: Home              Anticipated d/c is to: Unclear at this time given critical illness              Anticipated d/c date is: 2 days              Patient currently is not medically stable to d/c.   Consultants:   Palliative care  Procedures:     Antimicrobials: Anti-infectives (From admission, onward)    Start     Dose/Rate Route Frequency Ordered Stop   02/05/20 0800  cefTRIAXone (ROCEPHIN) 2 g in sodium chloride 0.9 % 100 mL IVPB     Discontinue     2 g 200 mL/hr over 30 Minutes Intravenous Every 24 hours 02/05/20 0048     02/05/20 0800  metroNIDAZOLE (FLAGYL) IVPB 500 mg     Discontinue     500 mg 100 mL/hr over 60 Minutes Intravenous Every 8 hours 02/05/20 0048     02/05/20 0015  ceFEPIme (MAXIPIME) 2 g in sodium chloride 0.9 % 100 mL IVPB        2 g 200 mL/hr over 30 Minutes Intravenous  Once 02/05/20 0000 02/05/20 0052   02/05/20 0015  metroNIDAZOLE (FLAGYL) IVPB 500 mg        500 mg 100 mL/hr over 60 Minutes Intravenous  Once 02/05/20 0000 02/05/20 0134   02/05/20 0015  vancomycin (VANCOCIN) IVPB 1000 mg/200 mL premix  Status:  Discontinued        1,000 mg 200 mL/hr over 60 Minutes Intravenous  Once 02/05/20 0000 02/05/20 0048      Subjective: More alert this morning, only repeats "help me"  Objective: Vitals:   02/05/20 1628 02/05/20 2356 02/06/20 0545 02/06/20 0738  BP: (!) 79/59 (!) 130/79  (!) 123/56  Pulse: (!) 106 95  91  Resp: 20 17  19   Temp: 98.6 F (37 C) 97.6 F (36.4 C)    TempSrc:  Axillary    SpO2: 100% 90% 92% 100%  Weight:      Height:        Intake/Output Summary (Last 24 hours) at 02/06/2020 1646 Last data filed at 02/06/2020 1300 Gross per 24 hour  Intake 1830.74 ml  Output 1275 ml  Net 555.74 ml   Filed Weights   02/05/20 0043  Weight: 52.2 kg    Examination: General exam: Awake, laying in bed, in nad Respiratory system: Normal respiratory effort, no wheezing Cardiovascular system: regular rate, s1, s2 Gastrointestinal system: Soft, nondistended, positive BS Central nervous system: CN2-12 grossly intact, strength intact Extremities: Perfused, no clubbing Skin: Normal skin turgor, no notable skin lesions seen Psychiatry: Unable to assess given current mentation   Data Reviewed: I have personally reviewed following labs and imaging  studies  CBC: Recent Labs  Lab 02/04/20 2230 02/05/20 0017 02/05/20 1233 02/06/20 0130  WBC 10.9*  --  12.0* 11.6*  NEUTROABS 10.0*  --   --   --   HGB 6.2* 6.5* 9.3* 8.2*  HCT 23.8* 19.0* 31.5* 28.1*  MCV 72.1*  --  76.5* 75.7*  PLT 529*  --  393 175   Basic Metabolic Panel: Recent Labs  Lab 02/04/20 2230 02/05/20 0017 02/06/20 0130  NA 144 143 144  K 4.4 4.3 3.9  CL 114* 111 115*  CO2 20*  --  19*  GLUCOSE 94 94 102*  BUN 43* 40* 48*  CREATININE 1.24* 1.30* 0.91  CALCIUM 8.6*  --  8.1*   GFR: Estimated Creatinine Clearance: 30.5 mL/min (by C-G formula based on SCr of 0.91 mg/dL). Liver Function Tests: Recent Labs  Lab 02/04/20 2230 02/06/20 0130  AST 20 21  ALT 15 13  ALKPHOS 64 65  BILITOT 0.5 0.3  PROT 5.3* 4.6*  ALBUMIN 2.2* 2.0*   No results for input(s): LIPASE, AMYLASE in the last 168 hours. No results for input(s): AMMONIA in the last 168 hours. Coagulation Profile: Recent Labs  Lab 02/04/20 2230  INR 1.2   Cardiac Enzymes: No results for input(s): CKTOTAL, CKMB, CKMBINDEX, TROPONINI in the last 168 hours. BNP (last 3 results) No results for input(s): PROBNP in the last 8760 hours. HbA1C: No results for input(s): HGBA1C in the last 72 hours. CBG: No results for input(s): GLUCAP in the last 168 hours. Lipid Profile: No results for input(s): CHOL, HDL, LDLCALC, TRIG, CHOLHDL, LDLDIRECT in the last 72 hours. Thyroid Function Tests: No results for input(s): TSH, T4TOTAL, FREET4, T3FREE, THYROIDAB in the last 72 hours. Anemia Panel: No results for input(s): VITAMINB12, FOLATE, FERRITIN, TIBC, IRON, RETICCTPCT in the last 72 hours. Sepsis Labs: Recent Labs  Lab 02/04/20 2227 02/05/20 0010  LATICACIDVEN 3.0* 3.1*    Recent Results (from the past 240 hour(s))  Blood culture (routine single)     Status: None (Preliminary result)   Collection Time: 02/04/20 11:48 PM   Specimen: BLOOD LEFT FOREARM  Result Value Ref Range Status   Specimen  Description BLOOD LEFT FOREARM  Final   Special Requests   Final    BOTTLES DRAWN AEROBIC AND ANAEROBIC Blood Culture adequate volume   Culture   Final    NO GROWTH 1 DAY Performed at [facility], [place] [address], [place], [place] [address]    Report Status PENDING  Incomplete  SARS Coronavirus 2 by RT PCR (hospital order, performed in [facility] hospital lab) Nasopharyngeal Nasopharyngeal Swab     Status: None   Collection Time: 02/05/20 12:47 AM   Specimen: Nasopharyngeal Swab  Result Value Ref Range Status   SARS Coronavirus 2 NEGATIVE NEGATIVE Final    Comment: (NOTE) SARS-CoV-2 target nucleic acids are NOT DETECTED.  The SARS-CoV-2 RNA is generally detectable in upper and lower respiratory specimens during the acute phase of infection. The lowest concentration of SARS-CoV-2 viral copies this assay can detect is 250 copies / mL. A negative result does not preclude SARS-CoV-2 infection and should not be used as the sole basis for treatment or other patient management decisions.  A negative result may occur with improper specimen collection / handling, submission of specimen other than nasopharyngeal swab, presence of viral mutation(s) within the areas targeted by this assay, and inadequate number of viral copies (<250 copies / mL). A negative result must be combined with clinical observations, patient history, and epidemiological information.  Fact Sheet for Patients:   StrictlyIdeas.no  Fact Sheet for Healthcare Providers: BankingDealers.co.za  This test is not yet approved or  cleared by the [place] FDA and has been authorized for detection and/or diagnosis of SARS-CoV-2 by FDA under an Emergency Use Authorization (EUA).  This EUA will remain in effect (meaning this test can be used) for the duration of the COVID-19 declaration under Section 564(b)(1) of the Act, 21 U.S.C. section 360bbb-3(b)(1), unless the  authorization is  terminated or revoked sooner.  Performed at [facility], [place] [address], [place], [place] [address]      Radiology Studies: DG Chest Port 1 View  Result Date: 02/04/2020 CLINICAL DATA:  Questionable sepsis.  Blood in stool.  Lethargy. EXAM: PORTABLE CHEST 1 VIEW COMPARISON:  None. FINDINGS: Heart is normal in size. Normal mediastinal contours. There is ill-defined right paratracheal soft tissue density. No confluent airspace disease. No pneumothorax or pleural effusion. No pulmonary edema. Advanced right shoulder osteoarthritis. Scoliotic curvature in the spine. Bones are under mineralized. IMPRESSION: Ill-defined right paratracheal soft tissue density. This may be overlapping vascular structures, however cannot exclude underlying adenopathy or mass. Electronically Signed   By: Keith Rake M.D.   On: 02/04/2020 22:48    Scheduled Meds: . sodium chloride   Intravenous Once  . polyethylene glycol  17 g Oral Daily   Continuous Infusions: . sodium chloride 100 mL/hr at 02/06/20 0600  . cefTRIAXone (ROCEPHIN)  IV 2 g (02/06/20 0814)  . metronidazole 500 mg (02/06/20 0815)     LOS: 1 day   Marylu Lund, MD [facility] Pager On Amion  If 7PM-7AM, please contact night-coverage 02/06/2020, 4:46 PM

## 2020-02-06 NOTE — Progress Notes (Signed)
Hydrologist [facility]) Hospital Liaison: RN note     This is a new hospice referral that was admitted to the hospital prior to her hospice admission visit. Hospice eligibility confirmed.     Please send signed and completed DNR form home with patient/family. Patient will need prescriptions for discharge comfort medications.    [facility] aware of the above. Please notify [facility] when patient is ready to leave the unit at discharge. (Call [phone] or [phone] after 5pm.)      [facility] Liaison has left messages with son to return call and ask about DME needs.      Please call with any hospice related questions.     Thank you for this referral.     Farrel Gordon, RN, [facility] (listed on [place] under [place])   [phone]

## 2020-02-07 MED ORDER — MORPHINE SULFATE (CONCENTRATE) 20 MG/ML PO SOLN: 10.0000 mg | ORAL | 0 refills | Status: AC | PRN

## 2020-02-07 MED ORDER — CEFDINIR 300 MG PO CAPS
600.0000 mg | ORAL_CAPSULE | Freq: Every day | ORAL | 0 refills | Status: AC
Start: 2020-02-07 — End: 2020-02-11

## 2020-02-07 MED ORDER — ONDANSETRON HCL 4 MG PO TABS: 4.0000 mg | ORAL_TABLET | Freq: Four times a day (QID) | ORAL | 0 refills | Status: AC | PRN

## 2020-02-07 NOTE — Discharge Summary (Signed)
Physician Discharge Summary  Stacey Clay XAJ:[id number] DOB: 07-06-1923 DOA: 02/04/2020  PCP: Gayland Curry, DO  Admit date: 02/04/2020 Discharge date: 02/07/2020  Admitted From: Home Disposition:  Home with Hospice  Recommendations for Outpatient Follow-up:  1. Follow up with PCP as needed 2. Follow up with hospice services  [place] reviewed. Limited quantity of narcotic will be provided for palliative purposes    Discharge Condition:Stable CODE STATUS:DNR Diet recommendation: Comfort   Brief/Interim Summary: 86 y.o.femalewith a past medical history of advanced dementia who is takencare of by her son. No history was available from the patient due to her dementia. Most of the information was obtained from ED provider and the patient's son. Patient apparently was in her usual state of health till about 830 to 9 PM last night when she had a large bowel movement which was quite bloody. Apparently he saw some dark stool as well. Patient's son denies any black-colored stools recently. No bloody emesis has been noted. Patient does not take any anticoagulation. She has not been taking any NSAIDs. Patient keeps yelling out for help but unable to describe her symptoms. She states that she does not have any pain. Son does not feel that the patient has any significant pain. Once again history is quite limited.  In the emergency department she was found to have a hemoglobin of 6.2. Her previous hemoglobin was 13 from about 2 years ago. She has GI bleed possibly lower GI in origin. She does have abdominal tenderness.   Discharge Diagnoses:  Principal Problem:   Hematochezia Active Problems:   Acute blood loss anemia   Advanced dementia ([place])   Encounter for hospice care discussion   Palliative care encounter   1. Goals of care:  -Ideally we would like to do CT scans but the patient isnot a candidate for any kind of interventions procedures or surgeries due to her advanced  dementia.  -Family reports that the main goal is for her to be comfortable.  -Patient would be agreeable with blood transfusion and antibiotics and IV fluids. -No plan for escalation of care -Palliative care is following, appreciated input.  Plan for discharge to home with hospice.  Anticipate discharge on 8/1 after necessary equipment liver to patient's house -Per Palliative Care, prognosis is likely <6 months  2. Hematochezia:  -Patient with acute blood loss anemia per below, receiving packed red blood cells at time of presentation -Patient not a candidate for further work-up of the same due to her advanced dementia.  Continue with supportive care -Per palliative care discussion, if patient survives past 6 months and afterwards still anemic, then would be open to another blood transfusion if needed  3. Acute blood loss anemia:  -Family had agreed upon blood transfusion at time of presentation. -At time of presentation, agreement was for not transfusing beyond 2 units that was already ordered  4. Advanced dementia with behavioral disturbances:  -was given Haldol for her agitation as needed. Remained stable  5. Concern for intra-abdominal infection/sepsis present on admission: Patient was noted to be tachycardic, tachypneic with elevated WBC and low blood pressures.  -Her abdomen is tender to palpation.  -However as noted above patient not a candidate for aggressive interventions.  -Patient is continued on ceftriaxone and metronidazole for now.  -No escalation of care.  -Continue with IV fluids as well.  -Lactate over 3 noted on presentation, improved with hydration reviewed -Likely transition to oral antibiotics at time of discharge  6. Acute renal failure:  -  Patient was continued on IV hydration   Discharge Instructions   Allergies as of 02/07/2020      Reactions   Aleve [naproxen Sodium]    Stomach bleeding   Aspirin    Stomach bleeding   Darvocet  [propoxyphene N-acetaminophen] Nausea And Vomiting   Darvon [propoxyphene Hcl] Nausea And Vomiting   Lomotil [diphenoxylate] Nausea And Vomiting   Ultram [tramadol] Nausea And Vomiting      Medication List    STOP taking these medications   diclofenac sodium 1 % Gel Commonly known as: VOLTAREN   meclizine 12.5 MG tablet Commonly known as: ANTIVERT   predniSONE 20 MG tablet Commonly known as: DELTASONE   pregabalin 50 MG capsule Commonly known as: Lyrica     TAKE these medications   cefdinir 300 MG capsule Commonly known as: OMNICEF Take 2 capsules (600 mg total) by mouth daily for 4 days.   morphine 20 MG/ML concentrated solution Commonly known as: ROXANOL Take 0.5 mLs (10 mg total) by mouth every 2 (two) hours as needed for severe pain or shortness of breath.   ondansetron 4 MG tablet Commonly known as: ZOFRAN Take 1 tablet (4 mg total) by mouth every 6 (six) hours as needed for nausea.       Follow-up Information    Reed, Tiffany L, DO Follow up.   Specialty: Geriatric Medicine Why: as needed Contact information: [place]. [place] [address] [phone]        Follow up with hospice services Follow up.              Allergies  Allergen Reactions  . Aleve [Naproxen Sodium]     Stomach bleeding  . Aspirin     Stomach bleeding  . Darvocet [Propoxyphene N-Acetaminophen] Nausea And Vomiting  . Darvon [Propoxyphene Hcl] Nausea And Vomiting  . Lomotil [Diphenoxylate] Nausea And Vomiting  . Ultram [Tramadol] Nausea And Vomiting    Consultations:  Palliative Care  Procedures/Studies: DG Chest Port 1 View  Result Date: 02/04/2020 CLINICAL DATA:  Questionable sepsis.  Blood in stool.  Lethargy. EXAM: PORTABLE CHEST 1 VIEW COMPARISON:  None. FINDINGS: Heart is normal in size. Normal mediastinal contours. There is ill-defined right paratracheal soft tissue density. No confluent airspace disease. No pneumothorax or pleural effusion. No pulmonary  edema. Advanced right shoulder osteoarthritis. Scoliotic curvature in the spine. Bones are under mineralized. IMPRESSION: Ill-defined right paratracheal soft tissue density. This may be overlapping vascular structures, however cannot exclude underlying adenopathy or mass. Electronically Signed   By: Keith Rake M.D.   On: 02/04/2020 22:48     Subjective: Repeats "please help me"  Discharge Exam: Vitals:   02/06/20 2305 02/07/20 0756  BP: (!) 118/56 (!) 130/83  Pulse: (!) 108 71  Resp: 15 19  Temp: 98.4 F (36.9 C) 97.8 F (36.6 C)  SpO2: 97% 91%   Vitals:   02/06/20 0545 02/06/20 0738 02/06/20 2305 02/07/20 0756  BP:  (!) 123/56 (!) 118/56 (!) 130/83  Pulse:  91 (!) 108 71  Resp:  19 15 19   Temp:   98.4 F (36.9 C) 97.8 F (36.6 C)  TempSrc:   Axillary   SpO2: 92% 100% 97% 91%  Weight:      Height:        General: Pt is alert, awake, not in acute distress Cardiovascular: RRR, S1/S2 +, no rubs, no gallops Respiratory: CTA bilaterally, no wheezing, no rhonchi Abdominal: Soft, NT, ND, bowel sounds + Extremities: no edema, no  cyanosis   The results of significant diagnostics from this hospitalization (including imaging, microbiology, ancillary and laboratory) are listed below for reference.     Microbiology: Recent Results (from the past 240 hour(s))  Blood culture (routine single)     Status: None (Preliminary result)   Collection Time: 02/04/20 11:48 PM   Specimen: BLOOD LEFT FOREARM  Result Value Ref Range Status   Specimen Description BLOOD LEFT FOREARM  Final   Special Requests   Final    BOTTLES DRAWN AEROBIC AND ANAEROBIC Blood Culture adequate volume   Culture   Final    NO GROWTH 2 DAYS Performed at [facility], [address], [place], [place] [address]    Report Status PENDING  Incomplete  SARS Coronavirus 2 by RT PCR (hospital order, performed in [facility] hospital lab) Nasopharyngeal Nasopharyngeal Swab     Status: None   Collection  Time: 02/05/20 12:47 AM   Specimen: Nasopharyngeal Swab  Result Value Ref Range Status   SARS Coronavirus 2 NEGATIVE NEGATIVE Final    Comment: (NOTE) SARS-CoV-2 target nucleic acids are NOT DETECTED.  The SARS-CoV-2 RNA is generally detectable in upper and lower respiratory specimens during the acute phase of infection. The lowest concentration of SARS-CoV-2 viral copies this assay can detect is 250 copies / mL. A negative result does not preclude SARS-CoV-2 infection and should not be used as the sole basis for treatment or other patient management decisions.  A negative result may occur with improper specimen collection / handling, submission of specimen other than nasopharyngeal swab, presence of viral mutation(s) within the areas targeted by this assay, and inadequate number of viral copies (<250 copies / mL). A negative result must be combined with clinical observations, patient history, and epidemiological information.  Fact Sheet for Patients:   StrictlyIdeas.no  Fact Sheet for Healthcare Providers: BankingDealers.co.za  This test is not yet approved or  cleared by the [place] FDA and has been authorized for detection and/or diagnosis of SARS-CoV-2 by FDA under an Emergency Use Authorization (EUA).  This EUA will remain in effect (meaning this test can be used) for the duration of the COVID-19 declaration under Section 564(b)(1) of the Act, 21 U.S.C. section 360bbb-3(b)(1), unless the authorization is terminated or revoked sooner.  Performed at [facility], [place] [address], [place], [place] [address]      Labs: BNP (last 3 results) No results for input(s): BNP in the last 8760 hours. Basic Metabolic Panel: Recent Labs  Lab 02/04/20 2230 02/05/20 0017 02/06/20 0130  NA 144 143 144  K 4.4 4.3 3.9  CL 114* 111 115*  CO2 20*  --  19*  GLUCOSE 94 94 102*  BUN 43* 40* 48*  CREATININE 1.24* 1.30* 0.91   CALCIUM 8.6*  --  8.1*   Liver Function Tests: Recent Labs  Lab 02/04/20 2230 02/06/20 0130  AST 20 21  ALT 15 13  ALKPHOS 64 65  BILITOT 0.5 0.3  PROT 5.3* 4.6*  ALBUMIN 2.2* 2.0*   No results for input(s): LIPASE, AMYLASE in the last 168 hours. No results for input(s): AMMONIA in the last 168 hours. CBC: Recent Labs  Lab 02/04/20 2230 02/05/20 0017 02/05/20 1233 02/06/20 0130  WBC 10.9*  --  12.0* 11.6*  NEUTROABS 10.0*  --   --   --   HGB 6.2* 6.5* 9.3* 8.2*  HCT 23.8* 19.0* 31.5* 28.1*  MCV 72.1*  --  76.5* 75.7*  PLT 529*  --  393 376   Cardiac Enzymes: No results for input(s): CKTOTAL, CKMB, CKMBINDEX, TROPONINI in the last 168 hours. BNP: Invalid input(s): POCBNP CBG: No results for input(s): GLUCAP in the last 168 hours. D-Dimer No results for input(s): DDIMER in the last 72 hours. Hgb A1c No results for input(s): HGBA1C in the last 72 hours. Lipid Profile No results for input(s): CHOL, HDL, LDLCALC, TRIG, CHOLHDL, LDLDIRECT in the last 72 hours. Thyroid function studies No results for input(s): TSH, T4TOTAL, T3FREE, THYROIDAB in the last 72 hours.  Invalid input(s): FREET3 Anemia work up No results for input(s): VITAMINB12, FOLATE, FERRITIN, TIBC, IRON, RETICCTPCT in the last 72 hours. Urinalysis    Component Value Date/Time   COLORURINE AMBER (A) 02/06/2020 0545   APPEARANCEUR HAZY (A) 02/06/2020 0545   LABSPEC 1.020 02/06/2020 0545   PHURINE 5.0 02/06/2020 0545   GLUCOSEU NEGATIVE 02/06/2020 0545   HGBUR NEGATIVE 02/06/2020 0545   BILIRUBINUR NEGATIVE 02/06/2020 0545   KETONESUR NEGATIVE 02/06/2020 0545   PROTEINUR 30 (A) 02/06/2020 0545   UROBILINOGEN 0.2 11/20/2012 1513   NITRITE NEGATIVE 02/06/2020 0545   LEUKOCYTESUR NEGATIVE 02/06/2020 0545   Sepsis Labs Invalid input(s): PROCALCITONIN,  WBC,  LACTICIDVEN Microbiology Recent Results (from the past 240 hour(s))  Blood culture (routine single)     Status: None (Preliminary result)    Collection Time: 02/04/20 11:48 PM   Specimen: BLOOD LEFT FOREARM  Result Value Ref Range Status   Specimen Description BLOOD LEFT FOREARM  Final   Special Requests   Final    BOTTLES DRAWN AEROBIC AND ANAEROBIC Blood Culture adequate volume   Culture   Final    NO GROWTH 2 DAYS Performed at [facility], [place] [address] [place], [place] [address]    Report Status PENDING  Incomplete  SARS Coronavirus 2 by RT PCR (hospital order, performed in [facility] hospital lab) Nasopharyngeal Nasopharyngeal Swab     Status: None   Collection Time: 02/05/20 12:47 AM   Specimen: Nasopharyngeal Swab  Result Value Ref Range Status   SARS Coronavirus 2 NEGATIVE NEGATIVE Final    Comment: (NOTE) SARS-CoV-2 target nucleic acids are NOT DETECTED.  The SARS-CoV-2 RNA is generally detectable in upper and lower respiratory specimens during the acute phase of infection. The lowest concentration of SARS-CoV-2 viral copies this assay can detect is 250 copies / mL. A negative result does not preclude SARS-CoV-2 infection and should not be used as the sole basis for treatment or other patient management decisions.  A negative result may occur with improper specimen collection / handling, submission of specimen other than nasopharyngeal swab, presence of viral mutation(s) within the areas targeted by this assay, and inadequate number of viral copies (<250 copies / mL). A negative result must be combined with clinical observations, patient history, and epidemiological information.  Fact Sheet for Patients:   StrictlyIdeas.no  Fact Sheet for Healthcare Providers: BankingDealers.co.za  This test is not yet approved or  cleared by the [place] FDA and has been authorized for detection and/or diagnosis of SARS-CoV-2 by FDA under an Emergency Use Authorization (EUA).  This EUA will remain in effect (meaning this test can be used) for the duration of  the COVID-19 declaration under Section 564(b)(1) of the Act, 21 U.S.C. section 360bbb-3(b)(1), unless the authorization is terminated or revoked sooner.  Performed at [facility], [place] [address] [place], [place] [address]    Time spent: 30 min  SIGNED:   Marylu Lund, MD  [facility] 02/07/2020, 9:31 AM  If 7PM-7AM, please contact night-coverage

## 2020-02-07 NOTE — Plan of Care (Signed)
  Problem: Education: Goal: Knowledge of General Education information will improve Description Including pain rating scale, medication(s)/side effects and non-pharmacologic comfort measures Outcome: Progressing   

## 2020-02-07 NOTE — Care Management (Addendum)
08:62 Called patient's son on his cell from 17 line, he answered.  Informed him that we will be trying to get bed delivered today in order to DC patient today. Requested him to answer all calls from unidentified and unknown numbers. He verbalized understanding and appreciation. Verified address and need for [facility] transport. Melissa w [facility] to set up delivery w [employer] for bed today.  13:30 Spoke w patient's son again and bed not yet delivered. He states he has been speaking with [facility] who is trying to arrange for it to be delivered today, He will call me back when bed is delivered so [facility] can be called.  16:00 Spoke w son who states that bed is getting delivered. Agreed upon 1730 [facility] pick up time. Address verified. [facility] called, nurse updated. DNR form left on chart, requested MD to sign.   [place], [place] Son [phone]  [phone]

## 2020-02-07 NOTE — Progress Notes (Signed)
Hydrologist [facility]) Hospital Liaison: RN note     This is a new hospice referral that was admitted to the hospital prior to her hospice admission visit. Hospice eligibility confirmed.     Spoke with son Clair Gulling about DME delivery. Discussed that an unfamiliar number would call to deliever DME today. I will update team once confirmed so patient can be D/C however if late in the day this could turn into a Monday discharge.   Please send signed and completed DNR form home with patient/family. Patient will need prescriptions for discharge comfort medications.        Please call with any hospice related questions.     Thank you for this referral.     Clementeen Hoof, RN, [facility] (listed on [place] under [place])   [phone]

## 2020-02-08 ENCOUNTER — Telehealth: Payer: Self-pay | Admitting: *Deleted

## 2020-02-08 DIAGNOSIS — R627 Adult failure to thrive: Secondary | ICD-10-CM | POA: Diagnosis not present

## 2020-02-08 DIAGNOSIS — K922 Gastrointestinal hemorrhage, unspecified: Secondary | ICD-10-CM | POA: Diagnosis not present

## 2020-02-08 DIAGNOSIS — D500 Iron deficiency anemia secondary to blood loss (chronic): Secondary | ICD-10-CM | POA: Diagnosis not present

## 2020-02-08 DIAGNOSIS — Z741 Need for assistance with personal care: Secondary | ICD-10-CM | POA: Diagnosis not present

## 2020-02-08 DIAGNOSIS — Z681 Body mass index (BMI) 19 or less, adult: Secondary | ICD-10-CM | POA: Diagnosis not present

## 2020-02-08 DIAGNOSIS — M1990 Unspecified osteoarthritis, unspecified site: Secondary | ICD-10-CM | POA: Diagnosis not present

## 2020-02-08 DIAGNOSIS — F0281 Dementia in other diseases classified elsewhere with behavioral disturbance: Secondary | ICD-10-CM | POA: Diagnosis not present

## 2020-02-08 DIAGNOSIS — Z7401 Bed confinement status: Secondary | ICD-10-CM | POA: Diagnosis not present

## 2020-02-08 DIAGNOSIS — G309 Alzheimer's disease, unspecified: Secondary | ICD-10-CM | POA: Diagnosis not present

## 2020-02-08 DIAGNOSIS — E43 Unspecified severe protein-calorie malnutrition: Secondary | ICD-10-CM | POA: Diagnosis not present

## 2020-02-08 NOTE — Telephone Encounter (Signed)
If it's possible for her son to bring her, I'm happy to see her.  If that's not possible b/c of her declining condition, I understand, and I'm sure hospice will take good care of her at home.

## 2020-02-08 NOTE — Telephone Encounter (Signed)
Patient was just Discharged from the Hospital. Has not been seen at our office since 2019. Has Hospice coming in.  Do you need to see her for a hospital follow up.  Please Advise.

## 2020-02-09 DIAGNOSIS — F0281 Dementia in other diseases classified elsewhere with behavioral disturbance: Secondary | ICD-10-CM | POA: Diagnosis not present

## 2020-02-09 DIAGNOSIS — E43 Unspecified severe protein-calorie malnutrition: Secondary | ICD-10-CM | POA: Diagnosis not present

## 2020-02-09 DIAGNOSIS — G309 Alzheimer's disease, unspecified: Secondary | ICD-10-CM | POA: Diagnosis not present

## 2020-02-09 DIAGNOSIS — D500 Iron deficiency anemia secondary to blood loss (chronic): Secondary | ICD-10-CM | POA: Diagnosis not present

## 2020-02-09 DIAGNOSIS — R627 Adult failure to thrive: Secondary | ICD-10-CM | POA: Diagnosis not present

## 2020-02-09 DIAGNOSIS — K922 Gastrointestinal hemorrhage, unspecified: Secondary | ICD-10-CM | POA: Diagnosis not present

## 2020-02-10 DIAGNOSIS — D500 Iron deficiency anemia secondary to blood loss (chronic): Secondary | ICD-10-CM | POA: Diagnosis not present

## 2020-02-10 DIAGNOSIS — G309 Alzheimer's disease, unspecified: Secondary | ICD-10-CM | POA: Diagnosis not present

## 2020-02-10 DIAGNOSIS — F0281 Dementia in other diseases classified elsewhere with behavioral disturbance: Secondary | ICD-10-CM | POA: Diagnosis not present

## 2020-02-10 DIAGNOSIS — E43 Unspecified severe protein-calorie malnutrition: Secondary | ICD-10-CM | POA: Diagnosis not present

## 2020-02-10 DIAGNOSIS — R627 Adult failure to thrive: Secondary | ICD-10-CM | POA: Diagnosis not present

## 2020-02-10 DIAGNOSIS — K922 Gastrointestinal hemorrhage, unspecified: Secondary | ICD-10-CM | POA: Diagnosis not present

## 2020-02-10 LAB — CULTURE, BLOOD (SINGLE)
Culture: NO GROWTH
Special Requests: ADEQUATE

## 2020-02-11 DIAGNOSIS — K922 Gastrointestinal hemorrhage, unspecified: Secondary | ICD-10-CM | POA: Diagnosis not present

## 2020-02-11 DIAGNOSIS — E43 Unspecified severe protein-calorie malnutrition: Secondary | ICD-10-CM | POA: Diagnosis not present

## 2020-02-11 DIAGNOSIS — G309 Alzheimer's disease, unspecified: Secondary | ICD-10-CM | POA: Diagnosis not present

## 2020-02-11 DIAGNOSIS — R627 Adult failure to thrive: Secondary | ICD-10-CM | POA: Diagnosis not present

## 2020-02-11 DIAGNOSIS — F0281 Dementia in other diseases classified elsewhere with behavioral disturbance: Secondary | ICD-10-CM | POA: Diagnosis not present

## 2020-02-11 DIAGNOSIS — D500 Iron deficiency anemia secondary to blood loss (chronic): Secondary | ICD-10-CM | POA: Diagnosis not present

## 2020-03-09 DEATH — deceased
